# Patient Record
Sex: Male | Born: 1955 | Race: White | Hispanic: No | Marital: Married | Smoking: Current every day smoker
Health system: Southern US, Community
[De-identification: ages and names within clinical notes are randomized; demographics above are authoritative.]

## PROBLEM LIST (undated history)

## (undated) DIAGNOSIS — R251 Tremor, unspecified: Secondary | ICD-10-CM

## (undated) DIAGNOSIS — I10 Essential (primary) hypertension: Secondary | ICD-10-CM

## (undated) DIAGNOSIS — M1990 Unspecified osteoarthritis, unspecified site: Secondary | ICD-10-CM

## (undated) DIAGNOSIS — R739 Hyperglycemia, unspecified: Secondary | ICD-10-CM

## (undated) DIAGNOSIS — R55 Syncope and collapse: Secondary | ICD-10-CM

## (undated) DIAGNOSIS — I2510 Atherosclerotic heart disease of native coronary artery without angina pectoris: Secondary | ICD-10-CM

## (undated) DIAGNOSIS — F411 Generalized anxiety disorder: Secondary | ICD-10-CM

## (undated) DIAGNOSIS — N400 Enlarged prostate without lower urinary tract symptoms: Secondary | ICD-10-CM

## (undated) DIAGNOSIS — R42 Dizziness and giddiness: Secondary | ICD-10-CM

## (undated) HISTORY — DX: Dizziness and giddiness: R42

## (undated) HISTORY — DX: Generalized anxiety disorder: F41.1

## (undated) HISTORY — DX: Hyperglycemia, unspecified: R73.9

## (undated) HISTORY — DX: Benign prostatic hyperplasia without lower urinary tract symptoms: N40.0

## (undated) HISTORY — DX: Syncope and collapse: R55

## (undated) HISTORY — DX: Tremor, unspecified: R25.1

## (undated) HISTORY — DX: Atherosclerotic heart disease of native coronary artery without angina pectoris: I25.10

---

## 2008-08-09 DIAGNOSIS — I219 Acute myocardial infarction, unspecified: Secondary | ICD-10-CM

## 2008-08-09 HISTORY — DX: Acute myocardial infarction, unspecified: I21.9

## 2008-08-09 HISTORY — PX: CORONARY ANGIOPLASTY: SHX604

## 2010-08-09 HISTORY — PX: LIPOMA EXCISION: SHX5283

## 2020-03-31 ENCOUNTER — Ambulatory Visit: Payer: Self-pay

## 2020-03-31 ENCOUNTER — Ambulatory Visit (INDEPENDENT_AMBULATORY_CARE_PROVIDER_SITE_OTHER): Payer: BC Managed Care – PPO | Admitting: Family Medicine

## 2020-03-31 ENCOUNTER — Other Ambulatory Visit: Payer: Self-pay

## 2020-03-31 ENCOUNTER — Ambulatory Visit (INDEPENDENT_AMBULATORY_CARE_PROVIDER_SITE_OTHER): Payer: BC Managed Care – PPO

## 2020-03-31 VITALS — BP 136/80 | HR 79 | Ht 71.0 in | Wt 224.0 lb

## 2020-03-31 DIAGNOSIS — G8929 Other chronic pain: Secondary | ICD-10-CM

## 2020-03-31 DIAGNOSIS — M75102 Unspecified rotator cuff tear or rupture of left shoulder, not specified as traumatic: Secondary | ICD-10-CM

## 2020-03-31 DIAGNOSIS — M25512 Pain in left shoulder: Secondary | ICD-10-CM

## 2020-03-31 DIAGNOSIS — M5136 Other intervertebral disc degeneration, lumbar region: Secondary | ICD-10-CM | POA: Diagnosis not present

## 2020-03-31 MED ORDER — GABAPENTIN 100 MG PO CAPS: 200.0000 mg | ORAL_CAPSULE | Freq: Every day | ORAL | 3 refills | Status: DC

## 2020-03-31 NOTE — Assessment & Plan Note (Signed)
Back pain, multifactorial, gabapentin given, discussed proper shoes, note to give lifting limit at work follow-up in 4 to 8 weeks

## 2020-03-31 NOTE — Progress Notes (Signed)
[name] [address] [place] [address] Phone: [phone] Subjective:   I Grant Ochoa am serving as a Neurosurgeon for Dr. Antoine Primas.  This visit occurred during the SARS-CoV-2 public health emergency.  Safety protocols were in place, including screening questions prior to the visit, additional usage of staff PPE, and extensive cleaning of exam room while observing appropriate contact time as indicated for disinfecting solutions.   I'm seeing this patient by the request  of:  Tarri Fuller, MD  CC: Left shoulder pain  [id number]  Grant Ochoa. is a 64 y.o. male coming in with complaint of left shoulder, lower back and bilateral hip pain. Patient states he can pop his shoulder and the pain goes away. Shoulder pain radiates down his arm. Primary care told him he has an issue at C5-C6. States he has the "shakes" really bad as of the past few months. Hard to hold a cup a coffee. Worse in the right hand.   Onset- Chronic  Location - lateral hip, low back, anterior  Duration-  Character- stabbing pain the shoulder, back dull ache sharp pain in hips  Aggravating factors- shoulder: carrying anything, walking on concrete (feet and hips)  Reliving factors-  Therapies tried- aleve, arnica Severity- 8-9/10 at its worse for the shoulder 5/10 at its worse    Neither of the pain seems to stop him from activity but states that it seems to be more difficult to even do daily activities especially with the shoulder.  States that walking on concrete for long amount and duration seems to be what aggravates the back and the bilateral hips the most.  Denies any weakness of the lower extremities  Social History   Socioeconomic History  . Marital status: Significant Other    Spouse name: Not on file  . Number of children: Not on file  . Years of education: Not on file  . Highest education level: Not on file  Occupational History  . Not on file    Tobacco Use  . Smoking status: Not on file  Substance and Sexual Activity  . Alcohol use: Not on file  . Drug use: Not on file  . Sexual activity: Not on file  Other Topics Concern  . Not on file  Social History Narrative  . Not on file   Social Determinants of Health   Financial Resource Strain:   . Difficulty of Paying Living Expenses: Not on file  Food Insecurity:   . Worried About Programme researcher, broadcasting/film/video in the Last Year: Not on file  . Ran Out of Food in the Last Year: Not on file  Transportation Needs:   . Lack of Transportation (Medical): Not on file  . Lack of Transportation (Non-Medical): Not on file  Physical Activity:   . Days of Exercise per Week: Not on file  . Minutes of Exercise per Session: Not on file  Stress:   . Feeling of Stress : Not on file  Social Connections:   . Frequency of Communication with Friends and Family: Not on file  . Frequency of Social Gatherings with Friends and Family: Not on file  . Attends Religious Services: Not on file  . Active Member of Clubs or Organizations: Not on file  . Attends Banker Meetings: Not on file  . Marital Status: Not on file   Not on File No family history on file.       Current Outpatient Medications (Other):  .  gabapentin (NEURONTIN) 100 MG capsule, Take 2 capsules (200 mg total) by mouth at bedtime.   Reviewed prior external information including notes and imaging from  primary care provider As well as notes that were available from care everywhere and other healthcare systems.  Past medical history, social, surgical and family history all reviewed in electronic medical record.  No pertanent information unless stated regarding to the chief complaint.   Review of Systems:  No headache, visual changes, nausea, vomiting, diarrhea, constipation, dizziness, abdominal pain, skin rash, fevers, chills, night sweats, weight loss, swollen lymph nodes, body aches, joint swelling, chest pain,  shortness of breath, mood changes. POSITIVE muscle aches  Objective  Blood pressure 136/80, pulse 79, height 5\' 11"  (1.803 m), weight 224 lb (101.6 kg), SpO2 96 %.   General: No apparent distress alert and oriented x3 mood and affect normal, dressed appropriately.  HEENT: Pupils equal, extraocular movements intact  Respiratory: Patient's speak in full sentences and does not appear short of breath  Cardiovascular: No lower extremity edema, non tender, no erythema  Neuro: Cranial nerves II through XII are intact, neurovascularly intact in all extremities with 2+ DTRs and 2+ pulses.  Gait normal with good balance and coordination.  MSK: Left shoulder exam shows some very mild atrophy of the surrounding musculature.  Positive impingement with Hawkins and Neer's.  Patient does have 4 out of 5 strength of the rotator cuff.  Positive Jurgenson sign and positive O'Brien's.  Near full passive range of motion contralateral shoulder unremarkable  Low back exam does have some loss of lordosis, tender to palpation in the paraspinal musculature lumbar spine diffusely.  Tightness with FABER test bilaterally and straight leg test but no true radicular symptoms.  Limited musculoskeletal ultrasound was performed and interpreted by  Limited ultrasound of patient's left shoulder shows that there is a rotator cuff noted that seems to be chronic patient wound does likely have some mild retraction noted of the subscapularis.  Bicep tendon has some hypoechoic changes surrounding it.  Supraspinatus has some chronic tearing with some potentially fat atrophy noted as well Impression: Rotator cuff tear  Judi Saa; 15 additional minutes spent for Therapeutic exercises as stated in above notes.  This included exercises focusing on stretching, strengthening, with significant focus on eccentric aspects.   Long term goals include an improvement in range of motion, strength, endurance as well as avoiding reinjury.  Patient's frequency would include in 1-2 times a day, 3-5 times a week for a duration of 6-12 weeks Shoulder Exercises that included:  Basic scapular stabilization to include adduction and depression of scapula Scaption, focusing on proper movement and good control Internal and External rotation utilizing a theraband, with elbow tucked at side entire time Rows with theraband  .  Proper technique shown and discussed handout in great detail with ATC.  All questions were discussed and answered.      Impression and Recommendations:     The above documentation has been reviewed and is accurate and complete [id number], DO       Note: This dictation was prepared with Dragon dictation along with smaller phrase technology. Any transcriptional errors that result from this process are unintentional.

## 2020-03-31 NOTE — Assessment & Plan Note (Signed)
Patient on ultrasound did have a tear but did respond well to the injection.  My feeling is that this tear is approximately 63 years old from patient's previous treatment options.  Concern for potential SLAP tear as well but I am highly optimistic patient can make some improvement.  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  Follow-up again in 6 weeks

## 2020-03-31 NOTE — Patient Instructions (Addendum)
  Good to see you.  Ice 20 minutes 2 times daily. Usually after activity and before bed. Exercises 3 times a week.  pennsaid pinkie amount topically 2 times daily as needed.  Gabapentin 200 mg at night   following over the counter  Turmeric 500mg  daily  Tart cherry extract 1200mg  at night Vitamin D 2000 IU daily  Spenco orthotics "total support" online would be great  See me again in 6 weeks and if shoulder not better will consider MRI

## 2020-04-01 ENCOUNTER — Encounter: Payer: Self-pay | Admitting: Family Medicine

## 2020-05-12 ENCOUNTER — Encounter: Payer: Self-pay | Admitting: Family Medicine

## 2020-05-12 ENCOUNTER — Other Ambulatory Visit: Payer: Self-pay

## 2020-05-12 ENCOUNTER — Ambulatory Visit (INDEPENDENT_AMBULATORY_CARE_PROVIDER_SITE_OTHER): Payer: BC Managed Care – PPO | Admitting: Family Medicine

## 2020-05-12 DIAGNOSIS — M75102 Unspecified rotator cuff tear or rupture of left shoulder, not specified as traumatic: Secondary | ICD-10-CM

## 2020-05-12 NOTE — Assessment & Plan Note (Signed)
Patient is doing better after the injection.  States that for 3 weeks was feeling good but continued do hardly labor with work trying to not use it as much.  Continue the gabapentin.  Continue the home exercises.  Patient is changing jobs we did discuss the possibility of an MRI but with patient improving and not doing surgical intervention patient declined it at this time.  Still concerned that this is a chronic tear but patient is continuing to stay active.  Follow-up with me again 6 to 8 weeks to discuss again advanced imaging.

## 2020-05-12 NOTE — Progress Notes (Signed)
[facility] [address] [place] [address] Phone: [phone] Subjective:   I Grant Ochoa am serving as a Neurosurgeon for Dr. Antoine Ochoa.  This visit occurred during the SARS-CoV-2 public health emergency.  Safety protocols were in place, including screening questions prior to the visit, additional usage of staff PPE, and extensive cleaning of exam room while observing appropriate contact time as indicated for disinfecting solutions.   I'm seeing this patient by the request  of:  Grant Fuller, MD  CC: Shoulder and back pain follow-up  [id number]   03/31/2020 Patient on ultrasound did have a tear but did respond well to the injection.  My feeling is that this tear is approximately 63 years old from patient's previous treatment options.  Concern for potential SLAP tear as well but I am highly optimistic patient can make some improvement.  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  Follow-up again in 6 weeks  Back pain, multifactorial, gabapentin given, discussed proper shoes, note to give lifting limit at work follow-up in 4 to 8 weeks  Update 05/12/2020 Grant Ochoa. is a 64 y.o. male coming in with complaint of left shoulder and back pain. Patient states for 3 weeks it was good. Employer did not value work note. Back is doing well.     Patient has x-rays that correspond to the rotator cuff showing potential rotator cuff arthropathy on the lateral aspect of the humeral head.  No past medical history on file. No past surgical history on file. Social History   Socioeconomic History  . Marital status: Significant Other    Spouse name: Not on file  . Number of children: Not on file  . Years of education: Not on file  . Highest education level: Not on file  Occupational History  .  Not on file  Tobacco Use  . Smoking status: Not on file  Substance and Sexual Activity  . Alcohol use: Not on file  . Drug use: Not on file  . Sexual activity: Not on file  Other Topics Concern  . Not on file  Social History Narrative  . Not on file   Social Determinants of Health   Financial Resource Strain:   . Difficulty of Paying Living Expenses: Not on file  Food Insecurity:   . Worried About Programme researcher, broadcasting/film/video in the Last Year: Not on file  . Ran Out of Food in the Last Year: Not on file  Transportation Needs:   . Lack of Transportation (Medical): Not on file  . Lack of Transportation (Non-Medical): Not on file  Physical Activity:   . Days of Exercise per Week: Not on file  . Minutes of Exercise per Session: Not on file  Stress:   . Feeling of Stress : Not on file  Social Connections:   . Frequency of Communication with Friends and Family: Not on file  . Frequency of Social Gatherings with Friends and Family: Not on file  . Attends Religious Services: Not on file  . Active Member of Clubs or Organizations: Not on file  . Attends Banker Meetings: Not on file  . Marital Status: Not on file   Not on File No family history on file.       Current Outpatient Medications (Other):  .  gabapentin (NEURONTIN)  100 MG capsule, Take 2 capsules (200 mg total) by mouth at bedtime.   Reviewed prior external information including notes and imaging from  primary care provider As well as notes that were available from care everywhere and other healthcare systems.  Past medical history, social, surgical and family history all reviewed in electronic medical record.  No pertanent information unless stated regarding to the chief complaint.   Review of Systems:  No headache, visual changes, nausea, vomiting, diarrhea, constipation, dizziness, abdominal pain, skin rash, fevers, chills, night sweats, weight loss, swollen lymph nodes, body aches, joint swelling, chest  pain, shortness of breath, mood changes. POSITIVE muscle aches  Objective  Blood pressure 102/80, pulse 73, height 5\' 11"  (1.803 m), weight 227 lb (103 kg), SpO2 97 %.   General: No apparent distress alert and oriented x3 mood and affect normal, dressed appropriately.  HEENT: Pupils equal, extraocular movements intact  Respiratory: Patient's speak in full sentences and does not appear short of breath  Cardiovascular: No lower extremity edema, non tender, no erythema  Neuro: Cranial nerves II through XII are intact, neurovascularly intact in all extremities with 2+ DTRs and 2+ pulses.  Gait normal with good balance and coordination.  MSK: Left shoulder exam does show the patient has some improvement in range of motion.  Still 4+ out of 5 strength compared to the contralateral side.  Patient does have very mild weakness with resisted external rotation of the shoulder.    Impression and Recommendations:     The above documentation has been reviewed and is accurate and complete , DO

## 2020-05-12 NOTE — Patient Instructions (Addendum)
Good to see you I am glad at the job change  I do you think you are making some improvement but lets keep a close eye  See me again in 6-8 weeks

## 2020-06-24 NOTE — Progress Notes (Signed)
[name] [address] [place] [address] Phone: [phone] Subjective:   I Ronelle Nigh am serving as a Neurosurgeon for Dr. Antoine Primas.  This visit occurred during the SARS-CoV-2 public health emergency.  Safety protocols were in place, including screening questions prior to the visit, additional usage of staff PPE, and extensive cleaning of exam room while observing appropriate contact time as indicated for disinfecting solutions.   I'm seeing this patient by the request  of:  Tarri Fuller, MD  CC: Shoulder pain follow-up   [id number]   05/12/2020 Patient is doing better after the injection.  States that for 3 weeks was feeling good but continued do hardly labor with work trying to not use it as much.  Continue the gabapentin.  Continue the home exercises.  Patient is changing jobs we did discuss the possibility of an MRI but with patient improving and not doing surgical intervention patient declined it at this time.  Still concerned that this is a chronic tear but patient is continuing to stay active.  Follow-up with me again 6 to 8 weeks to discuss again advanced imaging.  Update 06/26/2020 Baldomero Fernand Sorbello. is a 64 y.o. male coming in with complaint of left RTC tear. Patient states he is better but he still has some issues with certain movements.  Patient states that with the new job seems to be doing much better.  Really having very minimal discomfort over the course of time.  Would state that he is 85 to 90% better.  Patient denies any new symptoms.  Has been taking the gabapentin but is wondering if he needs that long-term.       No past medical history on file. No past surgical history on file. Social History   Socioeconomic History  . Marital status: Significant Other    Spouse name: Not on file  . Number of children: Not on file  . Years of education: Not on file  . Highest education level: Not on file  Occupational  History  . Not on file  Tobacco Use  . Smoking status: Not on file  Substance and Sexual Activity  . Alcohol use: Not on file  . Drug use: Not on file  . Sexual activity: Not on file  Other Topics Concern  . Not on file  Social History Narrative  . Not on file   Social Determinants of Health   Financial Resource Strain:   . Difficulty of Paying Living Expenses: Not on file  Food Insecurity:   . Worried About Programme researcher, broadcasting/film/video in the Last Year: Not on file  . Ran Out of Food in the Last Year: Not on file  Transportation Needs:   . Lack of Transportation (Medical): Not on file  . Lack of Transportation (Non-Medical): Not on file  Physical Activity:   . Days of Exercise per Week: Not on file  . Minutes of Exercise per Session: Not on file  Stress:   . Feeling of Stress : Not on file  Social Connections:   . Frequency of Communication with Friends and Family: Not on file  . Frequency of Social Gatherings with Friends and Family: Not on file  . Attends Religious Services: Not on file  . Active Member of Clubs or Organizations: Not on file  . Attends Banker Meetings: Not on file  . Marital Status: Not on file   Not on File No family history on file.  Current Outpatient Medications (Other):  .  gabapentin (NEURONTIN) 100 MG capsule, Take 2 capsules (200 mg total) by mouth at bedtime.   Reviewed prior external information including notes and imaging from  primary care provider As well as notes that were available from care everywhere and other healthcare systems.  Past medical history, social, surgical and family history all reviewed in electronic medical record.  No pertanent information unless stated regarding to the chief complaint.   Review of Systems:  No headache, visual changes, nausea, vomiting, diarrhea, constipation, dizziness, abdominal pain, skin rash, fevers, chills, night sweats, weight loss, swollen lymph nodes, body aches, joint  swelling, chest pain, shortness of breath, mood changes. POSITIVE muscle aches  Objective  Blood pressure 130/80, pulse (!) 55, height 5\' 11"  (1.803 m), weight 230 lb (104.3 kg), SpO2 98 %.   General: No apparent distress alert and oriented x3 mood and affect normal, dressed appropriately.  HEENT: Pupils equal, extraocular movements intact  Respiratory: Patient's speak in full sentences and does not appear short of breath  Cardiovascular: No lower extremity edema, non tender, no erythema  Patient shoulder exam shows near full range of motion at this time.  4++ out of 5 strength of the rotator cuff nearly as strong as the contralateral side.  Very mild positive impingement still noted to the anterior aspect and mild positive O'Brien's.   Impression and Recommendations:    The above documentation has been reviewed and is accurate and complete , DO

## 2020-06-26 ENCOUNTER — Encounter: Payer: Self-pay | Admitting: Family Medicine

## 2020-06-26 ENCOUNTER — Ambulatory Visit (INDEPENDENT_AMBULATORY_CARE_PROVIDER_SITE_OTHER): Payer: BLUE CROSS/BLUE SHIELD | Admitting: Family Medicine

## 2020-06-26 ENCOUNTER — Other Ambulatory Visit: Payer: Self-pay

## 2020-06-26 DIAGNOSIS — M5136 Other intervertebral disc degeneration, lumbar region: Secondary | ICD-10-CM

## 2020-06-26 DIAGNOSIS — M75102 Unspecified rotator cuff tear or rupture of left shoulder, not specified as traumatic: Secondary | ICD-10-CM

## 2020-06-26 NOTE — Assessment & Plan Note (Signed)
On gabapentin but does not know if he needs it.  Discussed using it as needed.

## 2020-06-26 NOTE — Assessment & Plan Note (Signed)
Patient is doing relatively well at this time.  We discussed that patient still has some pathology that seems to be more labral in nature and an MRI would further evaluate this.  Patient though would not want any surgical intervention and seems to be doing relatively well.  Discussed with patient about icing regimen and home exercises.  Discussed avoiding certain activities.  Patient wants to follow-up again in 3 months.  Understands at that time we will consider the possibility of going to imaging if necessary.

## 2020-06-26 NOTE — Patient Instructions (Signed)
Keep doing exercises Lets see how the new job goes Gabapentin as needed [employer] in [place] Let's check in in 2-3 months Let me know if you change mind on MRI

## 2020-07-03 ENCOUNTER — Other Ambulatory Visit: Payer: Self-pay

## 2020-07-03 ENCOUNTER — Encounter (HOSPITAL_BASED_OUTPATIENT_CLINIC_OR_DEPARTMENT_OTHER): Payer: Self-pay | Admitting: Emergency Medicine

## 2020-07-03 ENCOUNTER — Emergency Department (HOSPITAL_BASED_OUTPATIENT_CLINIC_OR_DEPARTMENT_OTHER): Payer: BC Managed Care – PPO

## 2020-07-03 ENCOUNTER — Emergency Department (HOSPITAL_BASED_OUTPATIENT_CLINIC_OR_DEPARTMENT_OTHER)
Admission: EM | Admit: 2020-07-03 | Discharge: 2020-07-03 | Disposition: A | Discharge status: Home / Self Care | Payer: BC Managed Care – PPO | Attending: Emergency Medicine | Admitting: Emergency Medicine

## 2020-07-03 DIAGNOSIS — S61213A Laceration without foreign body of left middle finger without damage to nail, initial encounter: Secondary | ICD-10-CM | POA: Diagnosis not present

## 2020-07-03 DIAGNOSIS — S61215A Laceration without foreign body of left ring finger without damage to nail, initial encounter: Secondary | ICD-10-CM | POA: Diagnosis not present

## 2020-07-03 DIAGNOSIS — S61214A Laceration without foreign body of right ring finger without damage to nail, initial encounter: Secondary | ICD-10-CM | POA: Diagnosis not present

## 2020-07-03 DIAGNOSIS — S61212A Laceration without foreign body of right middle finger without damage to nail, initial encounter: Secondary | ICD-10-CM | POA: Diagnosis not present

## 2020-07-03 DIAGNOSIS — S61211A Laceration without foreign body of left index finger without damage to nail, initial encounter: Secondary | ICD-10-CM | POA: Diagnosis not present

## 2020-07-03 DIAGNOSIS — W228XXA Striking against or struck by other objects, initial encounter: Secondary | ICD-10-CM | POA: Insufficient documentation

## 2020-07-03 DIAGNOSIS — S61412A Laceration without foreign body of left hand, initial encounter: Secondary | ICD-10-CM

## 2020-07-03 DIAGNOSIS — S6992XA Unspecified injury of left wrist, hand and finger(s), initial encounter: Secondary | ICD-10-CM | POA: Diagnosis present

## 2020-07-03 DIAGNOSIS — Y92094 Garage of other non-institutional residence as the place of occurrence of the external cause: Secondary | ICD-10-CM | POA: Diagnosis not present

## 2020-07-03 DIAGNOSIS — Z23 Encounter for immunization: Secondary | ICD-10-CM | POA: Insufficient documentation

## 2020-07-03 DIAGNOSIS — S61012A Laceration without foreign body of left thumb without damage to nail, initial encounter: Secondary | ICD-10-CM | POA: Insufficient documentation

## 2020-07-03 DIAGNOSIS — T07XXXA Unspecified multiple injuries, initial encounter: Secondary | ICD-10-CM

## 2020-07-03 HISTORY — DX: Atherosclerotic heart disease of native coronary artery without angina pectoris: I25.10

## 2020-07-03 MED ORDER — CEPHALEXIN 500 MG PO CAPS: 500.0000 mg | ORAL_CAPSULE | Freq: Three times a day (TID) | ORAL | 0 refills | Status: DC

## 2020-07-03 MED ORDER — SILVER NITRATE-POT NITRATE 75-25 % EX MISC: 5.0000 | Freq: Once | CUTANEOUS | Status: AC

## 2020-07-03 MED ORDER — SILVER NITRATE-POT NITRATE 75-25 % EX MISC
CUTANEOUS | Status: AC
  Administered 2020-07-03: 2 via TOPICAL
  Filled 2020-07-03: qty 10

## 2020-07-03 MED ORDER — LIDOCAINE HCL (PF) 1 % IJ SOLN
20.0000 mL | Freq: Once | INTRAMUSCULAR | Status: DC
  Filled 2020-07-03: qty 20

## 2020-07-03 MED ORDER — HYDROCODONE-ACETAMINOPHEN 5-325 MG PO TABS
1.0000 | ORAL_TABLET | ORAL | 0 refills | Status: DC | PRN
Start: 2020-07-03 — End: 2022-06-28

## 2020-07-03 MED ORDER — TETANUS-DIPHTH-ACELL PERTUSSIS 5-2.5-18.5 LF-MCG/0.5 IM SUSY
0.5000 mL | PREFILLED_SYRINGE | Freq: Once | INTRAMUSCULAR | Status: AC
  Administered 2020-07-03: 0.5 mL via INTRAMUSCULAR
  Filled 2020-07-03: qty 0.5

## 2020-07-03 NOTE — ED Notes (Signed)
Bilateral hand rinsed with saline + iodine

## 2020-07-03 NOTE — ED Notes (Addendum)
Pressure dressings applied by EDP to all affected fingers temporarily in preparation for Xray. All bleeding controlled.

## 2020-07-03 NOTE — ED Notes (Signed)
Tornicot applied to Left pointer finger by Dr. Rush Landmark

## 2020-07-03 NOTE — ED Provider Notes (Signed)
[facility] EMERGENCY DEPARTMENT Provider Note   CSN: [id number] Arrival date & time: 07/03/20  1124     History Chief Complaint  Patient presents with  . Hand Injury    Grant Ochoa. is a 64 y.o. male.  The history is provided by the patient, the spouse and medical records. No language interpreter was used.  Hand Injury Location:  Finger Finger location:  L index finger, L middle finger, L thumb, L ring finger, R middle finger and R ring finger Injury: yes   Time since incident:  1 hour Mechanism of injury: fall   Fall:    Impact surface: garadge door.   Point of impact:  Hands   Entrapped after fall: no   Pain details:    Quality:  Aching   Radiates to:  Does not radiate   Severity:  Moderate   Onset quality:  Sudden   Timing:  Constant   Progression:  Unchanged Handedness:  Right-handed Dislocation: no   Foreign body present:  No foreign bodies Tetanus status:  Unknown Prior injury to area:  No Relieved by:  Nothing Worsened by:  Nothing Ineffective treatments:  None tried Associated symptoms: muscle weakness and numbness   Associated symptoms: no back pain, no fatigue, no fever, no neck pain and no stiffness   Risk factors: no known bone disorder and no frequent fractures        Past Medical History:  Diagnosis Date  . Coronary artery disease     Patient Active Problem List   Diagnosis Date Noted  . Left rotator cuff tear 03/31/2020  . Degenerative disc disease, lumbar 03/31/2020    History reviewed. No pertinent surgical history.     No family history on file.  Social History   Tobacco Use  . Smoking status: Never Smoker  . Smokeless tobacco: Never Used  Substance Use Topics  . Alcohol use: Not on file  . Drug use: Not on file    Home Medications Prior to Admission medications   Medication Sig Start Date End Date Taking? Authorizing Provider  gabapentin (NEURONTIN) 100 MG capsule Take 2 capsules (200 mg total) by  mouth at bedtime. 03/31/20   Judi Saa, DO    Allergies    Patient has no known allergies.  Review of Systems   Review of Systems  Constitutional: Negative for chills, fatigue and fever.  HENT: Negative for congestion.   Eyes: Negative for visual disturbance.  Respiratory: Negative for cough, chest tightness, shortness of breath and wheezing.   Cardiovascular: Negative for chest pain.  Gastrointestinal: Negative for abdominal pain.  Genitourinary: Negative for flank pain.  Musculoskeletal: Negative for back pain, neck pain and stiffness.  Skin: Positive for wound.  Neurological: Negative for light-headedness and headaches.  All other systems reviewed and are negative.   Physical Exam Updated Vital Signs BP (!) 161/103 (BP Location: Right Arm)   Pulse 88   Resp 20   Ht 5\' 10"  (1.778 m)   Wt 97.5 kg   SpO2 98%   BMI 30.85 kg/m   Physical Exam Vitals and nursing note reviewed.  Constitutional:      General: He is in acute distress (actrive hemorrhage).     Appearance: He is not ill-appearing, toxic-appearing or diaphoretic.  HENT:     Nose: Nose normal. No congestion or rhinorrhea.     Mouth/Throat:     Mouth: Mucous membranes are moist.     Pharynx: No posterior oropharyngeal erythema.  Eyes:     Pupils: Pupils are equal, round, and reactive to light.  Cardiovascular:     Rate and Rhythm: Normal rate.     Pulses: Normal pulses.     Heart sounds: No murmur heard.   Pulmonary:     Effort: Pulmonary effort is normal.     Breath sounds: No wheezing, rhonchi or rales.  Chest:     Chest wall: No tenderness.  Abdominal:     General: Abdomen is flat.     Tenderness: There is no abdominal tenderness. There is no right CVA tenderness or left CVA tenderness.  Musculoskeletal:        General: Tenderness and signs of injury present.     Right hand: Laceration and tenderness present. Decreased strength. Decreased sensation. Normal capillary refill. Normal pulse.      Left hand: Laceration present. Normal capillary refill. Normal pulse.       Hands:     Cervical back: No tenderness.     Comments: Patient has evidence of likely tendinous injury to his left index finger and has some numbness in the left index, middle, and left ring finger.  No other numbness appreciated in the fingers.  All other strength intact including lumbrical movement wrist movement in all thumb range of motion.  No numbness in the thumb.  Before and after repair, all fingers have good capillary refill and were pink.  They were splinted.  Skin:    Capillary Refill: Capillary refill takes less than 2 seconds.     Coloration: Skin is not pale.  Neurological:     General: No focal deficit present.     Mental Status: He is alert.  Psychiatric:        Mood and Affect: Mood normal.     ED Results / Procedures / Treatments   Labs (all labs ordered are listed, but only abnormal results are displayed) Labs Reviewed - No data to display  EKG None  Radiology DG Hand Complete Left  Result Date: 07/03/2020 CLINICAL DATA:  Finger laceration after fall. EXAM: LEFT HAND - COMPLETE 3+ VIEW COMPARISON:  None. FINDINGS: There is bandage material overlying the distal second, third and fourth digits. The underlying osseous structures are intact. No fractures identified. No retained foreign bodies noted. IMPRESSION: 1. No acute bone abnormality. 2. No retained foreign bodies. Electronically Signed   By: Signa Kellaylor  Stroud M.D.   On: 07/03/2020 12:26   DG Hand Complete Right  Result Date: 07/03/2020 CLINICAL DATA:  Finger lacerations. EXAM: RIGHT HAND - COMPLETE 3+ VIEW COMPARISON:  None. FINDINGS: Bandage material overlies the second and third digits. No underlying fracture or dislocation identified. No radiopaque foreign bodies. IMPRESSION: 1. No acute findings. 2. No radiopaque foreign bodies identified. Electronically Signed   By: Signa Kellaylor  Stroud M.D.   On: 07/03/2020 12:27     Procedures .Marland Kitchen.Laceration Repair  Date/Time: 07/03/2020 3:35 PM Performed by: Heide Scalesegeler, Kaari Zeigler J, MD Authorized by: Heide Scalesegeler, Emie Sommerfeld J, MD   Consent:    Consent obtained:  Verbal   Consent given by:  Patient   Risks discussed:  Tendon damage, infection, pain, poor cosmetic result, poor wound healing, need for additional repair, nerve damage and vascular damage   Alternatives discussed:  No treatment Anesthesia (see MAR for exact dosages):    Anesthesia method:  Nerve block   Block location:  Digital blocks    Block needle gauge:  24 G   Block anesthetic:  Lidocaine 1% w/o epi   Block technique:  Digital   Block injection procedure:  Introduced needle, incremental injection, negative aspiration for blood, anatomic landmarks palpated and anatomic landmarks identified   Block outcome:  Anesthesia achieved Laceration details:    Location:  Finger   Finger location:  L index finger   Length (cm):  3   Depth (mm):  2 Repair type:    Repair type:  Complex Pre-procedure details:    Preparation:  Patient was prepped and draped in usual sterile fashion and imaging obtained to evaluate for foreign bodies Exploration:    Limited defect created (wound extended): no     Hemostasis achieved with:  Tied off vessels, tourniquet and direct pressure   Wound exploration: wound explored through full range of motion and entire depth of wound probed and visualized     Wound extent: tendon damage     Wound extent: no vascular damage noted  Nerve damage: possible.     Tendon damage location:  Upper extremity   Upper extremity tendon damage location:  Finger flexor   Tendon repair plan:  Refer for evaluation   Contaminated: no   Treatment:    Area cleansed with:  Saline and Shur-Clens   Amount of cleaning:  Standard   Irrigation solution:  Sterile saline   Irrigation method:  Syringe   Visualized foreign bodies/material removed: no     Debridement:  None Skin repair:    Repair  method:  Sutures   Suture size:  5-0   Suture material:  Nylon   Suture technique:  Simple interrupted and figure eight   Number of sutures:  12 Approximation:    Approximation:  Close Post-procedure details:    Dressing:  Bulky dressing and splint for protection   Patient tolerance of procedure:  Tolerated well, no immediate complications .Marland KitchenLaceration Repair  Date/Time: 07/03/2020 3:37 PM Performed by: Heide Scales, MD Authorized by: Heide Scales, MD   Consent:    Consent obtained:  Verbal Laceration details:    Location:  Finger   Finger location:  L ring finger   Length (cm):  3   Depth (mm):  2 Repair type:    Repair type:  Complex Pre-procedure details:    Preparation:  Patient was prepped and draped in usual sterile fashion and imaging obtained to evaluate for foreign bodies Exploration:    Limited defect created (wound extended): no     Hemostasis achieved with:  Direct pressure   Wound exploration: wound explored through full range of motion and entire depth of wound probed and visualized     Wound extent: no tendon damage noted, no underlying fracture noted and no vascular damage noted  Nerve damage: possible.     Contaminated: no   Treatment:    Area cleansed with:  Saline and Hibiclens   Amount of cleaning:  Standard   Irrigation solution:  Sterile saline   Irrigation method:  Syringe   Visualized foreign bodies/material removed: no     Debridement:  Minimal   Undermining:  None   Scar revision: no   Skin repair:    Repair method:  Sutures   Suture size:  5-0   Suture material:  Nylon   Suture technique:  Horizontal mattress and simple interrupted   Number of sutures:  10 Approximation:    Approximation:  Close Post-procedure details:    Dressing:  Bulky dressing and splint for protection   Patient tolerance of procedure:  Tolerated well, no immediate complications .Marland KitchenLaceration Repair  Date/Time: 07/03/2020 3:39  PM Performed by:  Heide Scales, MD Authorized by: Heide Scales, MD   Consent:    Consent obtained:  Verbal   Consent given by:  Patient Anesthesia (see MAR for exact dosages):    Anesthesia method:  Nerve block Laceration details:    Location:  Finger   Finger location:  L long finger   Length (cm):  3   Depth (mm):  2 Repair type:    Repair type:  Intermediate Exploration:    Hemostasis achieved with:  Direct pressure   Wound extent: no tendon damage noted, no underlying fracture noted and no vascular damage noted  Nerve damage: possible.   Treatment:    Area cleansed with:  Hibiclens and saline   Amount of cleaning:  Standard   Irrigation solution:  Sterile saline   Irrigation method:  Syringe Skin repair:    Repair method:  Sutures   Suture size:  5-0   Suture material:  Nylon   Suture technique:  Simple interrupted   Number of sutures:  8 Post-procedure details:    Dressing:  Bulky dressing and splint for protection   Patient tolerance of procedure:  Tolerated well, no immediate complications   (including critical care time)  CRITICAL CARE Performed by: Canary Brim Martha Ellerby Total critical care time: 20 minutes Critical care time was exclusive of separately billable procedures and treating other patients. Needed emergent use of finger tourniquet and figure-of-eight suture to achieve hemostasis from severe hemorrhage on arrival. Critical care was necessary to treat or prevent imminent or life-threatening deterioration. Critical care was time spent personally by me on the following activities: development of treatment plan with patient and/or surrogate as well as nursing, discussions with consultants, evaluation of patient's response to treatment, examination of patient, obtaining history from patient or surrogate, ordering and performing treatments and interventions, ordering and review of laboratory studies, ordering and review of radiographic studies, pulse oximetry  and re-evaluation of patient's condition.  Medications Ordered in ED Medications  lidocaine (PF) (XYLOCAINE) 1 % injection 20 mL (has no administration in time range)  Tdap (BOOSTRIX) injection 0.5 mL (0.5 mLs Intramuscular Given 07/03/20 1225)  silver nitrate applicators applicator 5 Stick (2 Sticks Topical Given by Other 07/03/20 1439)    ED Course  I have reviewed the triage vital signs and the nursing notes.  Pertinent labs & imaging results that were available during my care of the patient were reviewed by me and considered in my medical decision making (see chart for details).    MDM Rules/Calculators/A&P                          Grant Ochoa. is a 64 y.o. male with a past medical history significant for CAD status post PCI, hypertension, hyperlipidemia, and on aspirin therapy who presents with head lacerations.  Patient reports that he was sitting in the garage on a stool when the stool collapsed and he reached out to try to catch himself and cut both of his hands on a garage door edge.  He reports immediate onset of severe bleeding that began soaking through multiple towels on the way here.  He did not hit his head or lose consciousness.  He denies other injuries aside from both hands.  He is right-handed.  On arrival, patient had arterial bleeding from his left index finger.  He was also oozing blood from his left thumb, middle finger, ring finger, and left ring finger  and middle finger.  Bleeding was controlled with a figure-of-eight stitch in the left index finger superficially that stopped the pulsatile bleeding.  Pressure dressings were then placed on all of the injured fingers and x-rays were obtained.  There were no fractures or foreign bodies that were seen.  Tetanus was updated.  Patient had laceration repairs on his fingers.    After laceration repair, we reassessed all of the tips of his fingers and they all had good cap refill and were pink.  They were wrapped  and dressed and splinted so he will not bend them or rip out the sutures.  He will follow-up with Dr. Merlyn Lot with hand surgery and will call them on Monday.  Patient knows to watch for signs symptoms of infection.  He knows to wash the tips of his fingers for decreased color.  Prescription for antibiotics was given as was pain medicine given the extent of the injuries.  Patient and family understand return precautions and follow-up instructions and had no other questions or concerns.  Patient was discharged in good condition.   Final Clinical Impression(s) / ED Diagnoses Final diagnoses:  Multiple lacerations  Laceration of left hand involving tendon, initial encounter    Rx / DC Orders ED Discharge Orders         Ordered    cephALEXin (KEFLEX) 500 MG capsule  3 times daily        07/03/20 1524    HYDROcodone-acetaminophen (NORCO/VICODIN) 5-325 MG tablet  Every 4 hours PRN        07/03/20 1524          Clinical Impression: 1. Multiple lacerations   2. Laceration of left hand involving tendon, initial encounter     Disposition: Discharge  Condition: Good  I have discussed the results, Dx and Tx plan with the pt(& family if present). He/she/they expressed understanding and agree(s) with the plan. Discharge instructions discussed at great length. Strict return precautions discussed and pt &/or family have verbalized understanding of the instructions. No further questions at time of discharge.    New Prescriptions   CEPHALEXIN (KEFLEX) 500 MG CAPSULE    Take 1 capsule (500 mg total) by mouth 3 (three) times daily for 7 days.   HYDROCODONE-ACETAMINOPHEN (NORCO/VICODIN) 5-325 MG TABLET    Take 1 tablet by mouth every 4 (four) hours as needed.    Follow Up: Betha Loa, MD [address] [place] [address] [phone]   with hand surgery. please call on monday to schedule appointment.     Mosie Angus, Canary Brim, MD 07/03/20 [phone]

## 2020-07-03 NOTE — Discharge Instructions (Addendum)
Your history and exam today are consistent with multiple lacerations on both of your hands.  X-rays today did not show evidence of fracture or any foreign bodies.  After speaking with hand surgery, they requested we repaired the wounds externally and then follow-up in clinic next week to discuss further management of the left index finger that likely has a tendon injury as well.  He will give further guidance on suture middle but typically is around 10 days for arrival.  Please take the antibiotics to prevent infection at the recommendation.  Please use the pain medicines up with a discomfort and please be careful not to bend the fingers and tear the sutures.  You had 1 absorbable suture and numerous nonabsorbable sutures.  Please watch for signs and symptoms of infection.  If any symptoms change or worsen, please return to the nearest emergency department.

## 2020-07-03 NOTE — ED Notes (Signed)
ED Provider at bedside. 

## 2020-07-03 NOTE — ED Triage Notes (Signed)
He tripped and fell and grabbed a garage door. Lacerations to fingers of both hands. Bleeding is not controlled.

## 2020-07-03 NOTE — ED Notes (Signed)
ED Provider at bedside for sutures. 

## 2020-07-03 NOTE — ED Notes (Signed)
XR at bedside

## 2020-07-03 NOTE — ED Provider Notes (Addendum)
..Laceration Repair  Date/Time: 07/03/2020 2:57 PM Performed by: Gailen Shelter, PA Authorized by: Gailen Shelter, PA   Consent:    Consent obtained:  Verbal   Consent given by:  Patient   Risks discussed:  Infection, need for additional repair, pain, poor cosmetic result and poor wound healing   Alternatives discussed:  No treatment and delayed treatment Universal protocol:    Procedure explained and questions answered to patient or proxy's satisfaction: yes     Relevant documents present and verified: yes     Test results available and properly labeled: yes     Imaging studies available: yes     Required blood products, implants, devices, and special equipment available: yes     Site/side marked: yes     Immediately prior to procedure, a time out was called: yes     Patient identity confirmed:  Verbally with patient Anesthesia (see MAR for exact dosages):    Anesthesia method:  Local infiltration   Local anesthetic:  Lidocaine 1% WITH epi Laceration details:    Location:  Finger   Finger location:  L thumb   Length (cm):  2 Repair type:    Repair type:  Simple Exploration:    Hemostasis achieved with:  Direct pressure and epinephrine   Wound extent: no foreign bodies/material noted and no tendon damage noted     Contaminated: no   Treatment:    Area cleansed with:  Saline   Amount of cleaning:  Standard   Irrigation solution:  Sterile saline   Irrigation method:  Pressure wash   Visualized foreign bodies/material removed: no   Skin repair:    Repair method:  Sutures   Suture size:  5-0   Suture material:  Nylon (ethilon )   Suture technique:  Simple interrupted   Number of sutures:  3 Approximation:    Approximation:  Close Post-procedure details:    Dressing:  Antibiotic ointment and non-adherent dressing   Patient tolerance of procedure:  Tolerated well, no immediate complications .Marland KitchenLaceration Repair  Date/Time: 07/03/2020 2:59 PM Performed by: Gailen Shelter, PA Authorized by: Gailen Shelter, PA   Consent:    Consent obtained:  Verbal   Consent given by:  Patient   Risks discussed:  Infection, need for additional repair, pain, poor cosmetic result and poor wound healing   Alternatives discussed:  No treatment and delayed treatment Universal protocol:    Procedure explained and questions answered to patient or proxy's satisfaction: yes     Relevant documents present and verified: yes     Test results available and properly labeled: yes     Imaging studies available: yes     Required blood products, implants, devices, and special equipment available: yes     Site/side marked: yes     Immediately prior to procedure, a time out was called: yes     Patient identity confirmed:  Verbally with patient Anesthesia (see MAR for exact dosages):    Anesthesia method:  Local infiltration   Local anesthetic:  Lidocaine 1% WITH epi Laceration details:    Location:  Finger   Finger location:  L ring finger   Length (cm):  1 Repair type:    Repair type:  Simple Exploration:    Hemostasis achieved with:  Direct pressure and epinephrine   Wound extent: no foreign bodies/material noted and no tendon damage noted     Contaminated: no   Treatment:    Area cleansed with:  Saline  Amount of cleaning:  Standard   Irrigation solution:  Sterile saline   Irrigation method:  Pressure wash   Visualized foreign bodies/material removed: no   Skin repair:    Repair method:  Sutures   Suture size:  5-0   Suture material:  Nylon   Suture technique:  Simple interrupted   Number of sutures:  1 Approximation:    Approximation:  Close Post-procedure details:    Dressing:  Antibiotic ointment and non-adherent dressing   Patient tolerance of procedure:  Tolerated well, no immediate complications Cauterization  Date/Time: 07/03/2020 3:01 PM Performed by: Gailen Shelter, PA Authorized by: Gailen Shelter, PA  Local anesthesia used:  no  Anesthesia: Local anesthesia used: no  Sedation: Patient sedated: no  Patient tolerance: patient tolerated the procedure well with no immediate complications Comments: Cautery of the right index finger at the middle pad the volar aspect. There was a 1 cm avulsion with active bleeding. Controlled with cautery.  Wrapped w/ coban    Remaining (extensive) repairs done by Dr. Jerene Canny, Rodrigo Ran, PA 07/03/20 1502    Tegeler, Canary Brim, MD 07/03/20 1514    [place], [place] 07/03/20 1541    Tegeler, Canary Brim, MD 07/03/20 1549

## 2020-07-03 NOTE — ED Notes (Signed)
EDP at bedside  

## 2020-07-03 NOTE — ED Notes (Signed)
X-Reason at bedside.

## 2020-07-03 NOTE — ED Notes (Signed)
tornicot removed from L ring finger by EDP, bleeding better controlled

## 2020-07-03 NOTE — ED Notes (Signed)
ED Provider at bedside for repair of lacs

## 2020-07-09 ENCOUNTER — Other Ambulatory Visit: Payer: Self-pay | Admitting: Orthopedic Surgery

## 2020-07-09 ENCOUNTER — Telehealth: Payer: Self-pay | Admitting: Family Medicine

## 2020-07-09 NOTE — Telephone Encounter (Signed)
Spoke with patient.

## 2020-07-09 NOTE — Telephone Encounter (Signed)
Per notres that I reviewed from ED and form Dr. Merlyn Lot, surgery does likely give him best chance at recovery.  I have to defer to Dr. Merlyn Lot on this one

## 2020-07-09 NOTE — Progress Notes (Signed)
Patient scheduled at [facility] for for left hand exploration from laceration. 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 He just reconnected with cards 03-20-20 Dr Judithe Modest. Pt was complaining of palpitations and left shoulder pain at that time and Dr Judithe Modest ordered a myoview stress test and a long term monitor. Patient states at that time he was busy with his job and missed all appts. Chart was reviewed with Dr Chaney Malling and he states that if done at [facility] he will need to have tests done first and if moved to Main OR he will need cardiac clearance to progress with surgery. Steward Drone at Dr Merrilee Seashore office notified.

## 2020-07-09 NOTE — Telephone Encounter (Signed)
Patient called stating that he injured his hand on Thanksgiving day and they are wanting to do surgery. He asked if you or Dr Katrinka Blazing could call him to get your opinion on what he should do.  Please advise.

## 2020-07-11 ENCOUNTER — Other Ambulatory Visit (HOSPITAL_COMMUNITY): Admission: RE | Admit: 2020-07-11 | Payer: BC Managed Care – PPO | Source: Ambulatory Visit

## 2020-07-12 ENCOUNTER — Other Ambulatory Visit (HOSPITAL_COMMUNITY)
Admission: RE | Admit: 2020-07-12 | Discharge: 2020-07-12 | Disposition: A | Discharge status: Home / Self Care | Payer: BC Managed Care – PPO | Source: Ambulatory Visit | Attending: Orthopedic Surgery | Admitting: Orthopedic Surgery

## 2020-07-12 DIAGNOSIS — S61012A Laceration without foreign body of left thumb without damage to nail, initial encounter: Secondary | ICD-10-CM | POA: Diagnosis present

## 2020-07-12 DIAGNOSIS — Z20822 Contact with and (suspected) exposure to covid-19: Secondary | ICD-10-CM | POA: Insufficient documentation

## 2020-07-12 DIAGNOSIS — Z888 Allergy status to other drugs, medicaments and biological substances status: Secondary | ICD-10-CM | POA: Diagnosis not present

## 2020-07-12 DIAGNOSIS — S61215A Laceration without foreign body of left ring finger without damage to nail, initial encounter: Secondary | ICD-10-CM | POA: Diagnosis not present

## 2020-07-12 DIAGNOSIS — X58XXXA Exposure to other specified factors, initial encounter: Secondary | ICD-10-CM | POA: Diagnosis not present

## 2020-07-12 DIAGNOSIS — F17210 Nicotine dependence, cigarettes, uncomplicated: Secondary | ICD-10-CM | POA: Diagnosis not present

## 2020-07-12 DIAGNOSIS — S61213A Laceration without foreign body of left middle finger without damage to nail, initial encounter: Secondary | ICD-10-CM | POA: Diagnosis not present

## 2020-07-12 DIAGNOSIS — Z955 Presence of coronary angioplasty implant and graft: Secondary | ICD-10-CM | POA: Diagnosis not present

## 2020-07-12 LAB — SARS CORONAVIRUS 2 (TAT 6-24 HRS): SARS Coronavirus 2: NEGATIVE

## 2020-07-14 ENCOUNTER — Encounter (HOSPITAL_COMMUNITY): Payer: Self-pay | Admitting: Orthopedic Surgery

## 2020-07-14 NOTE — Progress Notes (Signed)
Anesthesia Chart Review: Same day workup  History of NSTEMI in 2010 treated with stent to the RCA.  Last seen by cardiologist Dr. Judithe Modest 03/20/2020.  At that time he was re presenting after having not been seen for several years with complaint of palpitations.  Dr. Judithe Modest ordered event monitor, nuclear stress, echo for evaluation.  These tests have not yet been done.  However, patient has been cleared by Dr. Judithe Modest to undergo surgery at low risk from cardiac standpoint, clearance dated 07/11/20, copy on chart.  Will need DOS labs and eval.  EKG 03/20/2020 (narrative only, Care Everywhere): Sinus rhythm.  Rate 65.  LAD.  Nonspecific intraventricular block.  Nonspecific ST and T wave abnormality.   Zannie Cove [facility]/Anesthesiology Phone [phone] 07/14/2020 4:08 PM

## 2020-07-14 NOTE — Anesthesia Preprocedure Evaluation (Addendum)
Anesthesia Evaluation  Patient identified by MRN, date of birth, ID band Patient awake    Reviewed: Allergy & Precautions, NPO status , Patient's Chart, lab work & pertinent test results, reviewed documented beta blocker date and time   Airway Mallampati: II  TM Distance: >3 FB Neck ROM: Full    Dental  (+) Dental Advisory Given   Pulmonary Current Smoker and Patient abstained from smoking.,    Pulmonary exam normal        Cardiovascular hypertension, Pt. on medications and Pt. on home beta blockers + CAD, + Past MI and + Cardiac Stents  Normal cardiovascular exam     Neuro/Psych negative neurological ROS  negative psych ROS   GI/Hepatic negative GI ROS, Neg liver ROS,   Endo/Other   Obesity   Renal/GU negative Renal ROS     Musculoskeletal  (+) Arthritis ,   Abdominal   Peds  Hematology negative hematology ROS (+)   Anesthesia Other Findings Covid test negative   Reproductive/Obstetrics                            Anesthesia Physical Anesthesia Plan  ASA: III  Anesthesia Plan: Regional   Post-op Pain Management:    Induction: Intravenous  PONV Risk Score and Plan: 1 and Propofol infusion and Treatment may vary due to age or medical condition  Airway Management Planned: Natural Airway and Simple Face Mask  Additional Equipment: None  Intra-op Plan:   Post-operative Plan:   Informed Consent: I have reviewed the patients History and Physical, chart, labs and discussed the procedure including the risks, benefits and alternatives for the proposed anesthesia with the patient or authorized representative who has indicated his/her understanding and acceptance.       Plan Discussed with: CRNA and Anesthesiologist  Anesthesia Plan Comments:        Anesthesia Quick Evaluation

## 2020-07-14 NOTE — Progress Notes (Signed)
Spoke with pt for pre-op call. Pt has hx of MI in 2010 with 1 stent. Denies any recent chest pain or sob. Has seen his cardiologist, Dr. Judithe Modest this fall. Waiting on cardiac clearance from Dr. Judithe Modest, have requested that from Dr. Perry Mount office and Dr. Merrilee Seashore office. Pt denies being diagnosed with Diabetes or Pre-diabetes. Found an A1C on 10/20/18 that was 6.5  Covid test done on 07/12/20 and it's negative.  Pt states he's been in quarantine since the test was done and understands that he stays in quarantine until he comes to the hospital tomorrow.

## 2020-07-15 ENCOUNTER — Encounter (HOSPITAL_COMMUNITY): Payer: Self-pay | Admitting: Orthopedic Surgery

## 2020-07-15 ENCOUNTER — Ambulatory Visit (HOSPITAL_COMMUNITY)
Admission: RE | Admit: 2020-07-15 | Discharge: 2020-07-15 | Disposition: A | Discharge status: Home / Self Care | Payer: BC Managed Care – PPO | Attending: Orthopedic Surgery | Admitting: Orthopedic Surgery

## 2020-07-15 ENCOUNTER — Other Ambulatory Visit: Payer: Self-pay

## 2020-07-15 ENCOUNTER — Encounter (HOSPITAL_COMMUNITY)
Admission: RE | Disposition: A | Discharge status: Home / Self Care | Payer: Self-pay | Source: Home / Self Care | Attending: Orthopedic Surgery

## 2020-07-15 ENCOUNTER — Ambulatory Visit (HOSPITAL_COMMUNITY): Payer: BC Managed Care – PPO | Admitting: Physician Assistant

## 2020-07-15 DIAGNOSIS — Z955 Presence of coronary angioplasty implant and graft: Secondary | ICD-10-CM | POA: Insufficient documentation

## 2020-07-15 DIAGNOSIS — Z20822 Contact with and (suspected) exposure to covid-19: Secondary | ICD-10-CM | POA: Insufficient documentation

## 2020-07-15 DIAGNOSIS — S61012A Laceration without foreign body of left thumb without damage to nail, initial encounter: Secondary | ICD-10-CM | POA: Insufficient documentation

## 2020-07-15 DIAGNOSIS — S61215A Laceration without foreign body of left ring finger without damage to nail, initial encounter: Secondary | ICD-10-CM | POA: Insufficient documentation

## 2020-07-15 DIAGNOSIS — X58XXXA Exposure to other specified factors, initial encounter: Secondary | ICD-10-CM | POA: Insufficient documentation

## 2020-07-15 DIAGNOSIS — S66121A Laceration of flexor muscle, fascia and tendon of left index finger at wrist and hand level, initial encounter: Secondary | ICD-10-CM

## 2020-07-15 DIAGNOSIS — F17210 Nicotine dependence, cigarettes, uncomplicated: Secondary | ICD-10-CM | POA: Insufficient documentation

## 2020-07-15 DIAGNOSIS — Z888 Allergy status to other drugs, medicaments and biological substances status: Secondary | ICD-10-CM | POA: Insufficient documentation

## 2020-07-15 DIAGNOSIS — S61213A Laceration without foreign body of left middle finger without damage to nail, initial encounter: Secondary | ICD-10-CM | POA: Insufficient documentation

## 2020-07-15 DIAGNOSIS — Z419 Encounter for procedure for purposes other than remedying health state, unspecified: Secondary | ICD-10-CM

## 2020-07-15 HISTORY — DX: Essential (primary) hypertension: I10

## 2020-07-15 HISTORY — DX: Unspecified osteoarthritis, unspecified site: M19.90

## 2020-07-15 HISTORY — PX: ARTERY AND TENDON REPAIR: SHX5696

## 2020-07-15 LAB — BASIC METABOLIC PANEL
Anion gap: 10 (ref 5–15)
BUN: 13 mg/dL (ref 8–23)
CO2: 23 mmol/L (ref 22–32)
Calcium: 9.1 mg/dL (ref 8.9–10.3)
Chloride: 105 mmol/L (ref 98–111)
Creatinine, Ser: 0.94 mg/dL (ref 0.61–1.24)
GFR, Estimated: >60 mL/min (ref >60)
Glucose, Bld: 122 mg/dL — ABNORMAL HIGH (ref 70–99)
Potassium: 4.2 mmol/L (ref 3.5–5.1)
Sodium: 138 mmol/L (ref 135–145)

## 2020-07-15 LAB — CBC
HCT: 49.4 % (ref 39.0–52.0)
Hemoglobin: 16.3 g/dL (ref 13.0–17.0)
MCH: 30.8 pg (ref 26.0–34.0)
MCHC: 33 g/dL (ref 30.0–36.0)
MCV: 93.4 fL (ref 80.0–100.0)
Platelets: 290 10*3/uL (ref 150–400)
RBC: 5.29 MIL/uL (ref 4.22–5.81)
RDW: 13.1 % (ref 11.5–15.5)
WBC: 7.3 10*3/uL (ref 4.0–10.5)
nRBC: 0 % (ref 0.0–0.2)

## 2020-07-15 SURGERY — ARTERY AND TENDON REPAIR
Anesthesia: Regional | Site: Hand | Laterality: Left

## 2020-07-15 MED ORDER — LIDOCAINE 2% (20 MG/ML) 5 ML SYRINGE
INTRAMUSCULAR | Status: DC | PRN
  Administered 2020-07-15: 100 mg via INTRAVENOUS

## 2020-07-15 MED ORDER — BUPIVACAINE-EPINEPHRINE (PF) 0.5% -1:200000 IJ SOLN
INTRAMUSCULAR | Status: DC | PRN
  Administered 2020-07-15: 30 mL via PERINEURAL

## 2020-07-15 MED ORDER — PROPOFOL 500 MG/50ML IV EMUL
INTRAVENOUS | Status: DC | PRN
  Administered 2020-07-15: 50 ug/kg/min via INTRAVENOUS

## 2020-07-15 MED ORDER — ORAL CARE MOUTH RINSE: 15.0000 mL | Freq: Once | OROMUCOSAL | Status: DC

## 2020-07-15 MED ORDER — LACTATED RINGERS IV SOLN: INTRAVENOUS | Status: DC

## 2020-07-15 MED ORDER — FENTANYL CITRATE (PF) 250 MCG/5ML IJ SOLN
INTRAMUSCULAR | Status: AC
  Filled 2020-07-15: qty 5

## 2020-07-15 MED ORDER — FENTANYL CITRATE (PF) 250 MCG/5ML IJ SOLN
INTRAMUSCULAR | Status: DC | PRN
  Administered 2020-07-15: 50 ug via INTRAVENOUS

## 2020-07-15 MED ORDER — LIDOCAINE HCL (PF) 2 % IJ SOLN
INTRAMUSCULAR | Status: AC
  Filled 2020-07-15: qty 5

## 2020-07-15 MED ORDER — HYDROCODONE-ACETAMINOPHEN 5-325 MG PO TABS: ORAL_TABLET | ORAL | 0 refills | Status: DC

## 2020-07-15 MED ORDER — CHLORHEXIDINE GLUCONATE 0.12 % MT SOLN: 15.0000 mL | Freq: Once | OROMUCOSAL | Status: DC

## 2020-07-15 MED ORDER — CEFAZOLIN SODIUM-DEXTROSE 2-4 GM/100ML-% IV SOLN
2.0000 g | INTRAVENOUS | Status: AC
  Administered 2020-07-15: 2 g via INTRAVENOUS
  Filled 2020-07-15: qty 100

## 2020-07-15 MED ORDER — 0.9 % SODIUM CHLORIDE (POUR BTL) OPTIME
TOPICAL | Status: DC | PRN
  Administered 2020-07-15: 1000 mL

## 2020-07-15 MED ORDER — FENTANYL CITRATE (PF) 100 MCG/2ML IJ SOLN
INTRAMUSCULAR | Status: AC
  Filled 2020-07-15: qty 2

## 2020-07-15 MED ORDER — PHENYLEPHRINE 40 MCG/ML (10ML) SYRINGE FOR IV PUSH (FOR BLOOD PRESSURE SUPPORT)
PREFILLED_SYRINGE | INTRAVENOUS | Status: DC | PRN
  Administered 2020-07-15: 160 ug via INTRAVENOUS
  Administered 2020-07-15 (×2): 120 ug via INTRAVENOUS

## 2020-07-15 MED ORDER — MIDAZOLAM HCL 2 MG/2ML IJ SOLN
INTRAMUSCULAR | Status: AC
  Filled 2020-07-15: qty 2

## 2020-07-15 MED ORDER — PROPOFOL 10 MG/ML IV BOLUS
INTRAVENOUS | Status: AC
  Filled 2020-07-15: qty 20

## 2020-07-15 MED ORDER — BUPIVACAINE HCL (PF) 0.25 % IJ SOLN
INTRAMUSCULAR | Status: AC
  Filled 2020-07-15: qty 30

## 2020-07-15 MED ORDER — PROPOFOL 10 MG/ML IV BOLUS
INTRAVENOUS | Status: DC | PRN
  Administered 2020-07-15: 10 mg via INTRAVENOUS
  Administered 2020-07-15: 30 mg via INTRAVENOUS

## 2020-07-15 MED ORDER — LIDOCAINE HCL (PF) 1 % IJ SOLN
INTRAMUSCULAR | Status: AC
  Filled 2020-07-15: qty 30

## 2020-07-15 MED ORDER — CHLORHEXIDINE GLUCONATE 0.12 % MT SOLN
OROMUCOSAL | Status: AC
  Administered 2020-07-15: 15 mL
  Filled 2020-07-15: qty 15

## 2020-07-15 SURGICAL SUPPLY — 67 items
BAG DECANTER FOR FLEXI CONT (MISCELLANEOUS) IMPLANT
BAND RUBBER #18 3X1/16 STRL (MISCELLANEOUS) ×3 IMPLANT
BLADE MINI RND TIP GREEN BEAV (BLADE) IMPLANT
BLADE SURG 15 STRL LF DISP TIS (BLADE) ×1 IMPLANT
BLADE SURG 15 STRL SS (BLADE) ×2
BNDG ELASTIC 3X5.8 VLCR STR LF (GAUZE/BANDAGES/DRESSINGS) ×3 IMPLANT
BNDG ESMARK 4X9 LF (GAUZE/BANDAGES/DRESSINGS) ×3 IMPLANT
BNDG GAUZE ELAST 4 BULKY (GAUZE/BANDAGES/DRESSINGS) IMPLANT
CANISTER SUCT 3000ML PPV (MISCELLANEOUS) ×3 IMPLANT
CHLORAPREP W/TINT 26 (MISCELLANEOUS) ×3 IMPLANT
CORD BIPOLAR FORCEPS 12FT (ELECTRODE) ×3 IMPLANT
COVER MAYO STAND STRL (DRAPES) ×3 IMPLANT
COVER SURGICAL LIGHT HANDLE (MISCELLANEOUS) ×3 IMPLANT
COVER WAND RF STERILE (DRAPES) ×3 IMPLANT
CUFF TOURN SGL QUICK 18X4 (TOURNIQUET CUFF) ×3 IMPLANT
DECANTER SPIKE VIAL GLASS SM (MISCELLANEOUS) IMPLANT
DRAPE MICROSCOPE LEICA (MISCELLANEOUS) ×3 IMPLANT
DRAPE MICROSCOPE LEICA 54X105 (DRAPES) ×3 IMPLANT
DRAPE SURG 17X23 STRL (DRAPES) ×3 IMPLANT
GAUZE SPONGE 4X4 12PLY STRL (GAUZE/BANDAGES/DRESSINGS) ×3 IMPLANT
GAUZE XEROFORM 1X8 LF (GAUZE/BANDAGES/DRESSINGS) ×3 IMPLANT
GLOVE BIO SURGEON STRL SZ7.5 (GLOVE) ×3 IMPLANT
GLOVE BIOGEL PI IND STRL 8 (GLOVE) ×1 IMPLANT
GLOVE BIOGEL PI INDICATOR 8 (GLOVE) ×2
GOWN STRL REUS W/ TWL LRG LVL3 (GOWN DISPOSABLE) ×1 IMPLANT
GOWN STRL REUS W/ TWL XL LVL3 (GOWN DISPOSABLE) ×1 IMPLANT
GOWN STRL REUS W/TWL LRG LVL3 (GOWN DISPOSABLE) ×2
GOWN STRL REUS W/TWL XL LVL3 (GOWN DISPOSABLE) ×2
KIT BASIN OR (CUSTOM PROCEDURE TRAY) ×3 IMPLANT
LOOP VESSEL MAXI BLUE (MISCELLANEOUS) IMPLANT
NEEDLE 18GX1X1/2 (RX/OR ONLY) (NEEDLE) IMPLANT
NEEDLE HYPO 25GX1X1/2 BEV (NEEDLE) ×3 IMPLANT
NEEDLE HYPO 25X1 1.5 SAFETY (NEEDLE) ×3 IMPLANT
NS IRRIG 1000ML POUR BTL (IV SOLUTION) ×3 IMPLANT
PACK ORTHO EXTREMITY (CUSTOM PROCEDURE TRAY) ×3 IMPLANT
PAD CAST 3X4 CTTN HI CHSV (CAST SUPPLIES) ×1 IMPLANT
PAD CAST 4YDX4 CTTN HI CHSV (CAST SUPPLIES) ×1 IMPLANT
PADDING CAST ABS 4INX4YD NS (CAST SUPPLIES)
PADDING CAST ABS COTTON 4X4 ST (CAST SUPPLIES) IMPLANT
PADDING CAST COTTON 3X4 STRL (CAST SUPPLIES) ×2
PADDING CAST COTTON 4X4 STRL (CAST SUPPLIES) ×2
PADDING UNDERCAST 2 STRL (CAST SUPPLIES) ×2
PADDING UNDERCAST 2X4 STRL (CAST SUPPLIES) ×1 IMPLANT
SPEAR EYE SURG WECK-CEL (MISCELLANEOUS) ×3 IMPLANT
SPLINT PLASTER CAST XFAST 3X15 (CAST SUPPLIES) IMPLANT
SPLINT PLASTER EXTRA FAST 3X15 (CAST SUPPLIES) ×2
SPLINT PLASTER GYPS XFAST 3X15 (CAST SUPPLIES) ×1 IMPLANT
SPLINT PLASTER XTRA FASTSET 3X (CAST SUPPLIES)
STOCKINETTE 4X48 STRL (DRAPES) ×3 IMPLANT
SUT ETHIBOND 3-0 V-5 (SUTURE) IMPLANT
SUT ETHILON 4 0 PS 2 18 (SUTURE) ×12 IMPLANT
SUT FIBERWIRE 4-0 18 TAPR NDL (SUTURE)
SUT MERSILENE 6 0 P 1 (SUTURE) IMPLANT
SUT NYLON 9 0 VRM6 (SUTURE) ×3 IMPLANT
SUT PROLENE 2 0 SH DA (SUTURE) ×3 IMPLANT
SUT PROLENE 6 0 P 1 18 (SUTURE) IMPLANT
SUT PROLENE 6 0 P 3 18 (SUTURE) ×3 IMPLANT
SUT SILK 4 0 PS 2 (SUTURE) ×6 IMPLANT
SUT SUPRAMID 4-0 (SUTURE) ×3 IMPLANT
SUT VICRYL 4-0 PS2 18IN ABS (SUTURE) IMPLANT
SUTURE FIBERWR 4-0 18 TAPR NDL (SUTURE) IMPLANT
SYR BULB EAR ULCER 3OZ GRN STR (SYRINGE) ×3 IMPLANT
SYR CONTROL 10ML LL (SYRINGE) ×3 IMPLANT
TOWEL GREEN STERILE FF (TOWEL DISPOSABLE) ×6 IMPLANT
TUBE CONNECTING 12'X1/4 (SUCTIONS) ×1
TUBE CONNECTING 12X1/4 (SUCTIONS) ×2 IMPLANT
UNDERPAD 30X36 HEAVY ABSORB (UNDERPADS AND DIAPERS) ×3 IMPLANT

## 2020-07-15 NOTE — Transfer of Care (Signed)
Immediate Anesthesia Transfer of Care Note  Patient: Frederich Montilla.  Procedure(s) Performed: LEFT INDEX , LONG,AND RING FINGER EXPLORATION ,REPAIR TENDON,ARTERY AND NERVE (Left Hand)  Patient Location: PACU  Anesthesia Type:MAC and Regional  Level of Consciousness: awake, alert  and oriented  Airway & Oxygen Therapy: Patient Spontanous Breathing  Post-op Assessment: Report given to RN and Post -op Vital signs reviewed and stable  Post vital signs: Reviewed and stable  Last Vitals:  Vitals Value Taken Time  BP    Temp    Pulse 54 07/15/20 1830  Resp    SpO2 100 % 07/15/20 1830  Vitals shown include unvalidated device data.  Last Pain:  Vitals:   07/15/20 1312  TempSrc:   PainSc: 0-No pain      Patients Stated Pain Goal: 3 (29/24/46 2863)  Complications: No complications documented.

## 2020-07-15 NOTE — Anesthesia Procedure Notes (Signed)
Anesthesia Regional Block: Axillary brachial plexus block   Pre-Anesthetic Checklist: ,, timeout performed, Correct Patient, Correct Site, Correct Laterality, Correct Procedure, Correct Position, site marked, Risks and benefits discussed,  Surgical consent,  Pre-op evaluation,  At surgeon's request and post-op pain management  Laterality: Left  Prep: chloraprep       Needles:  Injection technique: Single-shot  Needle Type: Echogenic Needle     Needle Length: 5cm  Needle Gauge: 21     Additional Needles:   Narrative:  Start time: 07/15/2020 3:02 PM End time: 07/15/2020 3:06 PM Injection made incrementally with aspirations every 5 mL.  Performed by: Personally  Anesthesiologist: Beryle Lathe, MD  Additional Notes: No pain on injection. No increased resistance to injection. Injection made in 5cc increments. Good needle visualization. Patient tolerated the procedure well.

## 2020-07-15 NOTE — H&P (Signed)
  Grant Ochoa. is an 64 y.o. male.   Chief Complaint: left finger lacerations HPI: 64 yo male states he sustained lacerations to left thumb/index/long/ring fingers on garage door 07/03/20.  Seen at [facility] where wounds cleaned and sutured.  He wishes to proceed with operative exploration of wounds with repair of tendon/artery/nerve as necessary.  Allergies:  Allergies  Allergen Reactions  . Atorvastatin Nausea Only    GI upset  . Varenicline Nausea Only    Chantix Crazy Dreams    Past Medical History:  Diagnosis Date  . Arthritis   . Coronary artery disease   . Hypertension   . Myocardial infarction [facility]) 2010   stent    Past Surgical History:  Procedure Laterality Date  . CORONARY ANGIOPLASTY  2010  . LIPOMA EXCISION  2012   forehead    Family History: History reviewed. No pertinent family history.  Social History:   reports that he has been smoking cigarettes. He has been smoking about 1.00 pack per day. He has never used smokeless tobacco. He reports previous alcohol use. He reports that he does not use drugs.  Medications: No medications prior to admission.    No results found for this or any previous visit (from the past 48 hour(s)).  No results found.   A comprehensive review of systems was negative.  There were no vitals taken for this visit.  General appearance: alert, cooperative and appears stated age Head: Normocephalic, without obvious abnormality, atraumatic Neck: supple, symmetrical, trachea midline Cardio: regular rate and rhythm Resp: clear to auscultation bilaterally Extremities: Intact sensation and capillary refill all digits except decreased in the index and long fingers and radial ring finger.  +epl/fpl/io.  Lacerations volarly at index, long, ring fingers and on ulnar side of thumb. Pulses: 2+ and symmetric Skin: Skin color, texture, turgor normal. No rashes or lesions Neurologic: Grossly normal Incision/Wound: as  above  Assessment/Plan Left hand lacerations.  Plan exploration with repair of tendon/artery/nerve as necessary.  He states his thumb feels normal and he does not want exploration of that wound.  Risks, benefits, and alternatives of surgery have been discussed and the patient agrees with the plan of care.   Betha Loa 07/15/2020, 11:37 AM

## 2020-07-15 NOTE — Progress Notes (Signed)
Called Dr. Jean Rosenthal regarding patients elevated blood pressure of 195/114. Pt states he is anxious and has not smoked all day which is probably a contributing factor.

## 2020-07-15 NOTE — Discharge Instructions (Signed)

## 2020-07-15 NOTE — Op Note (Signed)
I assisted Surgeon(s) and Role:    * Betha Loa, MD - Primary    * Cindee Salt, MD on the Procedure(s): LEFT INDEX , LONG,AND RING FINGER EXPLORATION ,REPAIR TENDON,ARTERY AND NERVE on 07/15/2020.  I provided assistance on this case as follows: Set up approach debridement of index middle ring finger lacerations with exploration of arteries nerves and each both radial and ulnar with repair of profundus tendon of the index profundus tendon of the middle repair of radial digital artery and ulnar digital nerve of the ring finger using microsurgical technique repair ulnar digital artery and ulnar digital nerve using microsurgical techniques with exploration of radial digital artery and nerve middle finger repair of ulnar digital artery and nerve using microsurgical techniques of the index finger with exploration of radial digital artery and nerve index finger closure of the wounds and application of dressing and splints  Electronically signed by: Cindee Salt, MD Date: 07/15/2020 Time: 6:24 PM

## 2020-07-15 NOTE — Op Note (Addendum)
NAME: Grant Ochoa. MEDICAL RECORD NO: [id number] DATE OF BIRTH: 10-05-1955 FACILITY: [facility] LOCATION: [facility] OR PHYSICIAN: Tami Ribas, MD   OPERATIVE REPORT   DATE OF PROCEDURE: 07/15/20    PREOPERATIVE DIAGNOSIS:   Left thumb index long and ring finger lacerations   POSTOPERATIVE DIAGNOSIS:   1.  Left thumb laceration 2.  Left index finger FDP laceration 3.  Left index finger ulnar digital nerve laceration 4.  Left index finger ulnar digital artery laceration 5.  Left long finger FDP laceration 6.  Left long finger ulnar digital nerve laceration 7.  Left long finger ulnar digital artery laceration 8.  Left ring finger FDP partial laceration 9.  Left ring finger radial digital nerve laceration 10.  Left ring finger radial digital artery laceration 11.  Left ring finger ulnar digital nerve laceration 12.  Left ring finger ulnar digital artery laceration   PROCEDURE:   1.  Left index finger repair FDP zone 1 laceration 2.  Left index finger repair ulnar digital nerve laceration under microscope 3.  Left index finger repair ulnar digital artery laceration under microscope 4.  Left long finger repair FDP zone 1 laceration 5.  Left long finger repair ulnar digital nerve laceration under microscope 6.  Left long finger repair ulnar digital artery laceration under microscope 7.  Left ring finger repair radial digital artery laceration under microscope 8.  Left ring finger repair ulnar digital nerve laceration under microscope    SURGEON:  Betha Loa, M.D.   ASSISTANT: Cindee Salt, MD   ANESTHESIA:  Regional with sedation   INTRAVENOUS FLUIDS:  Per anesthesia flow sheet.   ESTIMATED BLOOD LOSS:  Minimal.   COMPLICATIONS:  None.   SPECIMENS:  none   TOURNIQUET TIME:    Total Tourniquet Time Documented: Upper Arm (Left) - 155 minutes Total: Upper Arm (Left) - 155 minutes    DISPOSITION:  Stable to PACU.   INDICATIONS: 64 year old male states he sustained  lacerations to the left thumb index long and ring fingers July 03, 2020 on his garage door.  He was seen at [facility] where the wounds were cleaned and sutured.  He followed up in the office.  Decreased sensation in the tips of the fingers and inability to flex at the DIP joint of the index and long fingers.  He wishes to proceed with operative exploration and repair of tendon artery nerve is necessary. Risks, benefits and alternatives of surgery were discussed including the risks of blood loss, infection, damage to nerves, vessels, tendons, ligaments, bone for surgery, need for additional surgery, complications with wound healing, continued pain, stiffness.  He voiced understanding of these risks and elected to proceed.  OPERATIVE COURSE:  After being identified preoperatively by myself,  the patient and I agreed on the procedure and site of the procedure.  The surgical site was marked.  Surgical consent had been signed. He was given IV antibiotics as preoperative antibiotic prophylaxis. He was transferred to the operating room and placed on the operating table in supine position with the Left upper extremity on an arm board.  Sedation was induced by the anesthesiologist. A regional block had been performed by anesthesia in preoperative holding.   Left upper extremity was prepped and draped in normal sterile orthopedic fashion.  A surgical pause was performed between the surgeons, anesthesia, and operating room staff and all were in agreement as to the patient, procedure, and site of procedure.  Tourniquet at the proximal aspect  of the extremity was inflated to 250 mmHg after exsanguination of the arm with an Esmarch bandage.    The sutures were removed.  The wounds were explored.  In the ring finger there was laceration of the radial and ulnar digital nerve and artery distal to the trifurcation.  There was a partial laceration of the FDP tendon with greater than 50% of the tendon footprint  intact.  In the ring finger there was laceration of the FDP tendon in zone 1 distal to the DIP joint.  The radial digital nerve and artery were intact.  The ulnar digital nerve and artery were lacerated.  In the index finger there was laceration of the FDP tendon in zone 1 proximal to the DIP joint.  The radial digital nerve and artery were intact.  The ulnar digital nerve and artery were lacerated.  The FDP tendon in the ring finger was able to be retrieved through the sheath.  It was distal enough to repair over a button.  A 2-0 Prolene suture was used.  This was passed in a pullout fashion through the tendon and then passed around the distal phalanx and out dorsally through the nail.  This was tied toward the end of the case.  In the index finger there was enough distal stump of tendon remaining to perform direct repair.  The proximal stump of the FDP tendon was able to be retrieved and passed back through the flexor sheath.  A 4-0 looped Supramid U was used in a modified Kessler technique.  The dorsal aspect of the tendon was sutured with a running epitendinous 6-0 Prolene suture.  The remaining portion of the suture was thrown through the distal stump of tendon in the suture tied with good approximation of the tendon.  The 6-0 Prolene was used to perform a running epitendinous suture along the palmar side of the tendon.  The microscope was brought in.  This was used for microdissection of the ulnar digital nerve and artery of the index finger.  The radial digital nerve and artery were identified and were confirmed to be intact.  The lumen of the ulnar digital artery was cleared.  The ulnar digital artery was repaired with a 9-0 nylon suture in a interrupted circumferential fashion.  The ulnar digital nerve was repaired again with a 9-0 nylon suture in an interrupted circumferential fashion.  Good reapproximation was obtained.  In the ring finger the microscope was again used for microdissection.  The radial  digital nerve and artery were identified and were intact.  The ulnar digital nerve and artery were lacerated.  This laceration was distal to the trifurcation.  A branch of the ulnar digital artery was identified.  The lumen was cleared.  The 9-0 nylon suture was used in an interrupted circumferential fashion to reapproximate the arterial ends.  2 branches of the ulnar digital nerve were then reapproximated with a 9-0 nylon suture in an interrupted circumferential fashion.  Good reapproximation was obtained.  The microscope was then used for microdissection and the ring finger.  There were no repairable nerve branches on the radial side which was distal to the trifurcation.  A branch of the radial digital artery was able to be identified.  The lumen was cleared and the 9-0 nylon suture used in an interrupted circumferential fashion to reapproximate the arterial ends.  On the ulnar side there was no repairable artery.  The branches of the ulnar digital nerve were reapproximated using a 9-0 nylon suture.  The wounds were copiously irrigated with sterile saline.  The Prolene suture was tied over the button on the dorsum of the ring finger.  Good reapproximation of the tendon ends to the tendon stump was obtained.  The wounds were closed with 4-0 nylon in a horizontal mattress fashion.  The wounds were dressed with sterile Xeroform and 4 x 4's and wrapped with a Kerlix bandage.  The tourniquet was deflated at 155 minutes.  Fingertips were pink with brisk capillary refill after deflation of tourniquet.    A dorsal blocking splint was placed with the wrist at approximately 30 degrees flexion with the MPs flexed and the IP is in resting position.  This was wrapped with Kerlix and Ace bandage.  The operative  drapes were broken down.  The patient was awoken from anesthesia safely.  He was transferred back to the stretcher and taken to PACU in stable condition.  I will see him back in the office in 1 week for postoperative  followup.  I will give him a prescription for Norco 5/325 1-2 tabs PO q6 hours prn pain, dispense # 20.   Betha Loa, MD Electronically signed, 07/15/20

## 2020-07-15 NOTE — Interval H&P Note (Signed)
History and Physical Interval Note:  07/15/2020 3:13 PM  Grant Ochoa.  has presented today for surgery, with the diagnosis of LEFT THUMB AND INDEX LONG AND RING FINGER LACERATION.  The various methods of treatment have been discussed with the patient and family. After consideration of risks, benefits and other options for treatment, the patient has consented to  Procedure(s): LEFT INDEX , LONG,AND RING FINGER EXPLORATION ,REPAIR TENDON,ARTERY AND NERVE AS NECESSARY (Left) as a surgical intervention.  The patient's history has been reviewed, patient examined, no change in status, stable for surgery.  I have reviewed the patient's chart and labs.  Questions were answered to the patient's satisfaction.     Betha Loa

## 2020-07-15 NOTE — Anesthesia Postprocedure Evaluation (Signed)
Anesthesia Post Note  Patient: Yoshito Gaza.  Procedure(s) Performed: LEFT INDEX , LONG,AND RING FINGER EXPLORATION ,REPAIR TENDON,ARTERY AND NERVE (Left Hand)     Patient location during evaluation: PACU Anesthesia Type: Regional Level of consciousness: awake and alert Pain management: pain level controlled Vital Signs Assessment: post-procedure vital signs reviewed and stable Respiratory status: spontaneous breathing and respiratory function stable Cardiovascular status: stable Postop Assessment: no apparent nausea or vomiting Anesthetic complications: no   No complications documented.  Last Vitals:  Vitals:   07/15/20 1845 07/15/20 1900  BP: (!) 163/96 (!) 176/81  Pulse: 60 (!) 58  Resp: 14 14  Temp:    SpO2: 100% 100%    Last Pain:  Vitals:   07/15/20 1900  TempSrc:   PainSc: 0-No pain                 Lurine Imel DANIEL

## 2020-07-16 ENCOUNTER — Encounter (HOSPITAL_COMMUNITY): Payer: Self-pay | Admitting: Orthopedic Surgery

## 2020-09-25 NOTE — Progress Notes (Signed)
[name] [address] [place] [address] Phone: [phone] Subjective:   I Grant Ochoa am serving as a Neurosurgeon for Dr. Antoine Primas.  This visit occurred during the SARS-CoV-2 public health emergency.  Safety protocols were in place, including screening questions prior to the visit, additional usage of staff PPE, and extensive cleaning of exam room while observing appropriate contact time as indicated for disinfecting solutions.   I'm seeing this patient by the request  of:  Tarri Fuller, MD  CC: Left shoulder pain follow-up  WNI:[id number]   06/26/2020 Patient is doing relatively well at this time.  We discussed that patient still has some pathology that seems to be more labral in nature and an MRI would further evaluate this.  Patient though would not want any surgical intervention and seems to be doing relatively well.  Discussed with patient about icing regimen and home exercises.  Discussed avoiding certain activities.  Patient wants to follow-up again in 3 months.  Understands at that time we will consider the possibility of going to imaging if necessary.  Update 09/26/2020 Grant Ochoa. is a 65 y.o. male coming in with complaint of left shoulder pain. Since last visit, patient has undergone left hand, index finger repair after an accident where he had laceration of flexor muscle after grabbing onto garage door rail. Also developed infection and had to have nail plate removed. Patient states he is still having some issues. It is not worse but the pain lingers. States he still has pain at night. Laying on his stomach and back increases pain.       Past Medical History:  Diagnosis Date  . Arthritis   . Coronary artery disease   . Hypertension   . Myocardial infarction [facility]) 2010   stent   Past Surgical History:  Procedure Laterality Date  . ARTERY AND TENDON REPAIR Left 07/15/2020   Procedure: LEFT INDEX , LONG,AND RING  FINGER EXPLORATION ,REPAIR TENDON,ARTERY AND NERVE;  Surgeon: Betha Loa, MD;  Location: [facility] OR;  Service: Orthopedics;  Laterality: Left;  . CORONARY ANGIOPLASTY  2010  . LIPOMA EXCISION  2012   forehead   Social History   Socioeconomic History  . Marital status: Married    Spouse name: Not on file  . Number of children: Not on file  . Years of education: Not on file  . Highest education level: Not on file  Occupational History  . Not on file  Tobacco Use  . Smoking status: Current Every Day Smoker    Packs/day: 1.00    Types: Cigarettes  . Smokeless tobacco: Never Used  Vaping Use  . Vaping Use: Some days  Substance and Sexual Activity  . Alcohol use: Not Currently  . Drug use: Never  . Sexual activity: Not on file  Other Topics Concern  . Not on file  Social History Narrative  . Not on file   Social Determinants of Health   Financial Resource Strain: Not on file  Food Insecurity: Not on file  Transportation Needs: Not on file  Physical Activity: Not on file  Stress: Not on file  Social Connections: Not on file   Allergies  Allergen Reactions  . Atorvastatin Nausea Only    GI upset  . Varenicline Nausea Only    Chantix Crazy Dreams   History reviewed. No pertinent family history.   Current Outpatient Medications (Cardiovascular):  .  metoprolol tartrate (LOPRESSOR) 25 MG tablet, Take 25 mg by  mouth daily. Marland Kitchen  omega-3 acid ethyl esters (LOVAZA) 1 g capsule, Take 1 g by mouth daily.  Current Outpatient Medications (Respiratory):  .  loratadine (CLARITIN) 10 MG tablet, Take 10 mg by mouth daily as needed for allergies.  Current Outpatient Medications (Analgesics):  .  aspirin EC 81 MG tablet, Take 81 mg by mouth at bedtime. Swallow whole. Marland Kitchen  HYDROcodone-acetaminophen (NORCO) 5-325 MG tablet, 1-2 tabs po q6 hours prn pain .  HYDROcodone-acetaminophen (NORCO/VICODIN) 5-325 MG tablet, Take 1 tablet by mouth every 4 (four) hours as needed. .  naproxen sodium  (ALEVE) 220 MG tablet, Take 440 mg by mouth daily as needed (pain).   Current Outpatient Medications (Other):  .  gabapentin (NEURONTIN) 100 MG capsule, Take 2 capsules (200 mg total) by mouth at bedtime.   Reviewed prior external information including notes and imaging from  primary care provider As well as notes that were available from care everywhere and other healthcare systems.  Past medical history, social, surgical and family history all reviewed in electronic medical record.  No pertanent information unless stated regarding to the chief complaint.   Review of Systems:  No headache, visual changes, nausea, vomiting, diarrhea, constipation, dizziness, abdominal pain, skin rash, fevers, chills, night sweats, weight loss, swollen lymph nodes, body aches, joint swelling, chest pain, shortness of breath, mood changes. POSITIVE muscle aches  Objective  Blood pressure 110/80, pulse 86, height 5\' 10"  (1.778 m), weight 230 lb (104.3 kg), SpO2 96 %.   General: No apparent distress alert and oriented x3 mood and affect normal, dressed appropriately.  HEENT: Pupils equal, extraocular movements intact  Respiratory: Patient's speak in full sentences and does not appear short of breath  Cardiovascular: No lower extremity edema, non tender, no erythema  Gait normal with good balance and coordination.  MSK: Patient does have postsurgical changes of the hand noted.  Does have weakness and decrease in range of motion of the second and third fingers of the left hand with decreasing strength. Patient's left shoulder does have positive crossover.  Positive impingement.  Rotator cuff strength though is improved with 4+ out of 5 strength.  Contralateral side is 5 out of 5 strength. Neck exam does have loss of lordosis.  Procedure: Real-time Ultrasound Guided Injection of left glenohumeral joint Device: GE Logiq E  Ultrasound guided injection is preferred based studies that show increased duration,  increased effect, greater accuracy, decreased procedural pain, increased response rate with ultrasound guided versus blind injection.  Verbal informed consent obtained.  Time-out conducted.  Noted no overlying erythema, induration, or other signs of local infection.  Skin prepped in a sterile fashion.  Local anesthesia: Topical Ethyl chloride.  With sterile technique and under real time ultrasound guidance:  Joint visualized.  21g 2 inch needle inserted posterior approach. Pictures taken for needle placement. Patient did have injection of 2 cc of 0.5% Marcaine, and 1cc of Kenalog 40 mg/dL. Completed without difficulty  Pain immediately resolved suggesting accurate placement of the medication.  Advised to call if fevers/chills, erythema, induration, drainage, or persistent bleeding.   Impression: Technically successful ultrasound guided injection.  Procedure: Real-time Ultrasound Guided Injection of left acromioclavicular joint Device: GE Logiq Q7 Ultrasound guided injection is preferred based studies that show increased duration, increased effect, greater accuracy, decreased procedural pain, increased response rate, and decreased cost with ultrasound guided versus blind injection.  Verbal informed consent obtained.  Time-out conducted.  Noted no overlying erythema, induration, or other signs of local infection.  Skin  prepped in a sterile fashion.  Local anesthesia: Topical Ethyl chloride.  With sterile technique and under real time ultrasound guidance: With a 25-gauge half inch needle injected with 0.5 cc of 0.5% Marcaine and 0.5 cc of Kenalog 40 mg/mL Completed without difficulty  Pain immediately resolved suggesting accurate placement of the medication.  Advised to call if fevers/chills, erythema, induration, drainage, or persistent bleeding.  Impression: Technically successful ultrasound guided injection.    Impression and Recommendations:     The above documentation has been  reviewed and is accurate and complete Judi Saa, DO

## 2020-09-26 ENCOUNTER — Ambulatory Visit: Payer: Self-pay

## 2020-09-26 ENCOUNTER — Encounter: Payer: Self-pay | Admitting: Family Medicine

## 2020-09-26 ENCOUNTER — Other Ambulatory Visit: Payer: Self-pay

## 2020-09-26 ENCOUNTER — Ambulatory Visit (INDEPENDENT_AMBULATORY_CARE_PROVIDER_SITE_OTHER): Payer: BC Managed Care – PPO | Admitting: Family Medicine

## 2020-09-26 VITALS — BP 110/80 | HR 86 | Ht 70.0 in | Wt 230.0 lb

## 2020-09-26 DIAGNOSIS — G8929 Other chronic pain: Secondary | ICD-10-CM | POA: Diagnosis not present

## 2020-09-26 DIAGNOSIS — M25512 Pain in left shoulder: Secondary | ICD-10-CM

## 2020-09-26 DIAGNOSIS — M75102 Unspecified rotator cuff tear or rupture of left shoulder, not specified as traumatic: Secondary | ICD-10-CM | POA: Diagnosis not present

## 2020-09-26 DIAGNOSIS — M19012 Primary osteoarthritis, left shoulder: Secondary | ICD-10-CM | POA: Diagnosis not present

## 2020-09-26 NOTE — Assessment & Plan Note (Signed)
Repeat injection given again today.  We did discuss about again the advanced imaging and did not consider it.  Patient states though that unfortunately because of his recent hand surgery and pain he would like to get through this before he would consider anything else.  Patient does feel like he has made progress.  We discussed continuing the gabapentin.  Discussed other medications that could be helpful as well.  Hoping that patient does make good progress.  Follow-up again in 6 to 12 weeks.  Once again I will encourage advanced imaging if he continues to have difficulty

## 2020-09-26 NOTE — Patient Instructions (Signed)
Good to see you Injections today Continue the exercises Still need to consider MRI you declined today but if you don't make significant improvement  See me again in 6-12 weeks

## 2020-09-26 NOTE — Assessment & Plan Note (Signed)
Patient given injection and tolerated the procedure well.  Discussed which activities to doing which one to avoid.  Patient has not had any injection in this area but did have swelling on ultrasound.  Follow-up again in 6 to 12 weeks

## 2020-12-17 ENCOUNTER — Ambulatory Visit: Payer: BC Managed Care – PPO | Admitting: Family Medicine

## 2021-11-24 DIAGNOSIS — F411 Generalized anxiety disorder: Secondary | ICD-10-CM | POA: Diagnosis not present

## 2021-11-24 DIAGNOSIS — E782 Mixed hyperlipidemia: Secondary | ICD-10-CM | POA: Diagnosis not present

## 2021-11-24 DIAGNOSIS — I10 Essential (primary) hypertension: Secondary | ICD-10-CM | POA: Diagnosis not present

## 2021-11-24 DIAGNOSIS — R739 Hyperglycemia, unspecified: Secondary | ICD-10-CM | POA: Diagnosis not present

## 2022-01-22 DIAGNOSIS — R200 Anesthesia of skin: Secondary | ICD-10-CM | POA: Diagnosis not present

## 2022-01-22 DIAGNOSIS — R251 Tremor, unspecified: Secondary | ICD-10-CM | POA: Diagnosis not present

## 2022-01-22 DIAGNOSIS — R5383 Other fatigue: Secondary | ICD-10-CM | POA: Diagnosis not present

## 2022-01-22 DIAGNOSIS — R202 Paresthesia of skin: Secondary | ICD-10-CM | POA: Diagnosis not present

## 2022-05-13 DIAGNOSIS — R55 Syncope and collapse: Secondary | ICD-10-CM | POA: Diagnosis not present

## 2022-05-13 DIAGNOSIS — R42 Dizziness and giddiness: Secondary | ICD-10-CM | POA: Diagnosis not present

## 2022-05-13 DIAGNOSIS — J209 Acute bronchitis, unspecified: Secondary | ICD-10-CM | POA: Diagnosis not present

## 2022-05-13 DIAGNOSIS — I4510 Unspecified right bundle-branch block: Secondary | ICD-10-CM | POA: Diagnosis not present

## 2022-05-13 DIAGNOSIS — I440 Atrioventricular block, first degree: Secondary | ICD-10-CM | POA: Diagnosis not present

## 2022-06-04 DIAGNOSIS — R739 Hyperglycemia, unspecified: Secondary | ICD-10-CM | POA: Diagnosis not present

## 2022-06-04 DIAGNOSIS — Z0000 Encounter for general adult medical examination without abnormal findings: Secondary | ICD-10-CM | POA: Diagnosis not present

## 2022-06-08 DIAGNOSIS — I10 Essential (primary) hypertension: Secondary | ICD-10-CM | POA: Diagnosis not present

## 2022-06-08 DIAGNOSIS — E119 Type 2 diabetes mellitus without complications: Secondary | ICD-10-CM | POA: Diagnosis not present

## 2022-06-08 DIAGNOSIS — Z0000 Encounter for general adult medical examination without abnormal findings: Secondary | ICD-10-CM | POA: Diagnosis not present

## 2022-06-08 DIAGNOSIS — I2510 Atherosclerotic heart disease of native coronary artery without angina pectoris: Secondary | ICD-10-CM | POA: Diagnosis not present

## 2022-06-08 DIAGNOSIS — N400 Enlarged prostate without lower urinary tract symptoms: Secondary | ICD-10-CM | POA: Diagnosis not present

## 2022-06-11 IMAGING — DX DG HAND COMPLETE 3+V*R*
3 series · 3 of 3 positions shown · non-contrast
Comparison: None.

CLINICAL DATA: Finger lacerations.

EXAM:
RIGHT HAND - COMPLETE 3+ VIEW

[hand ap]
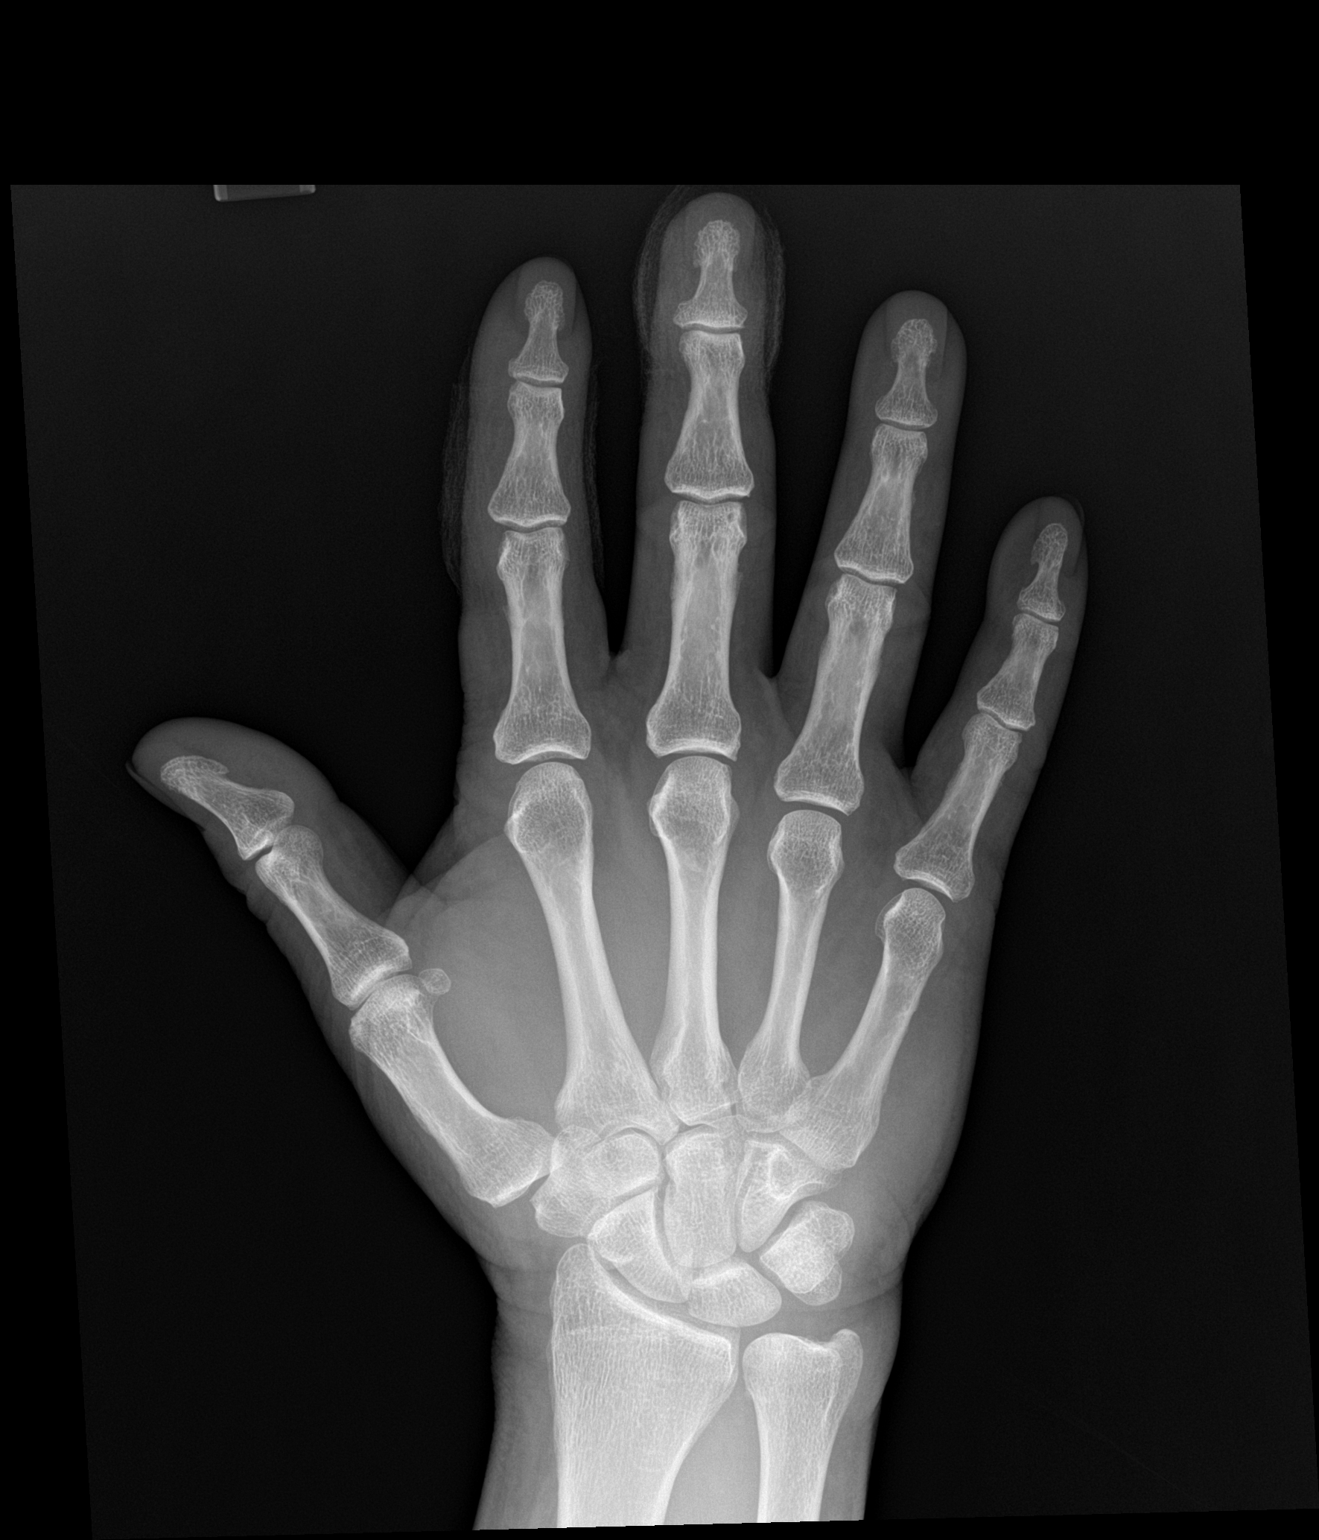

[hand obl]
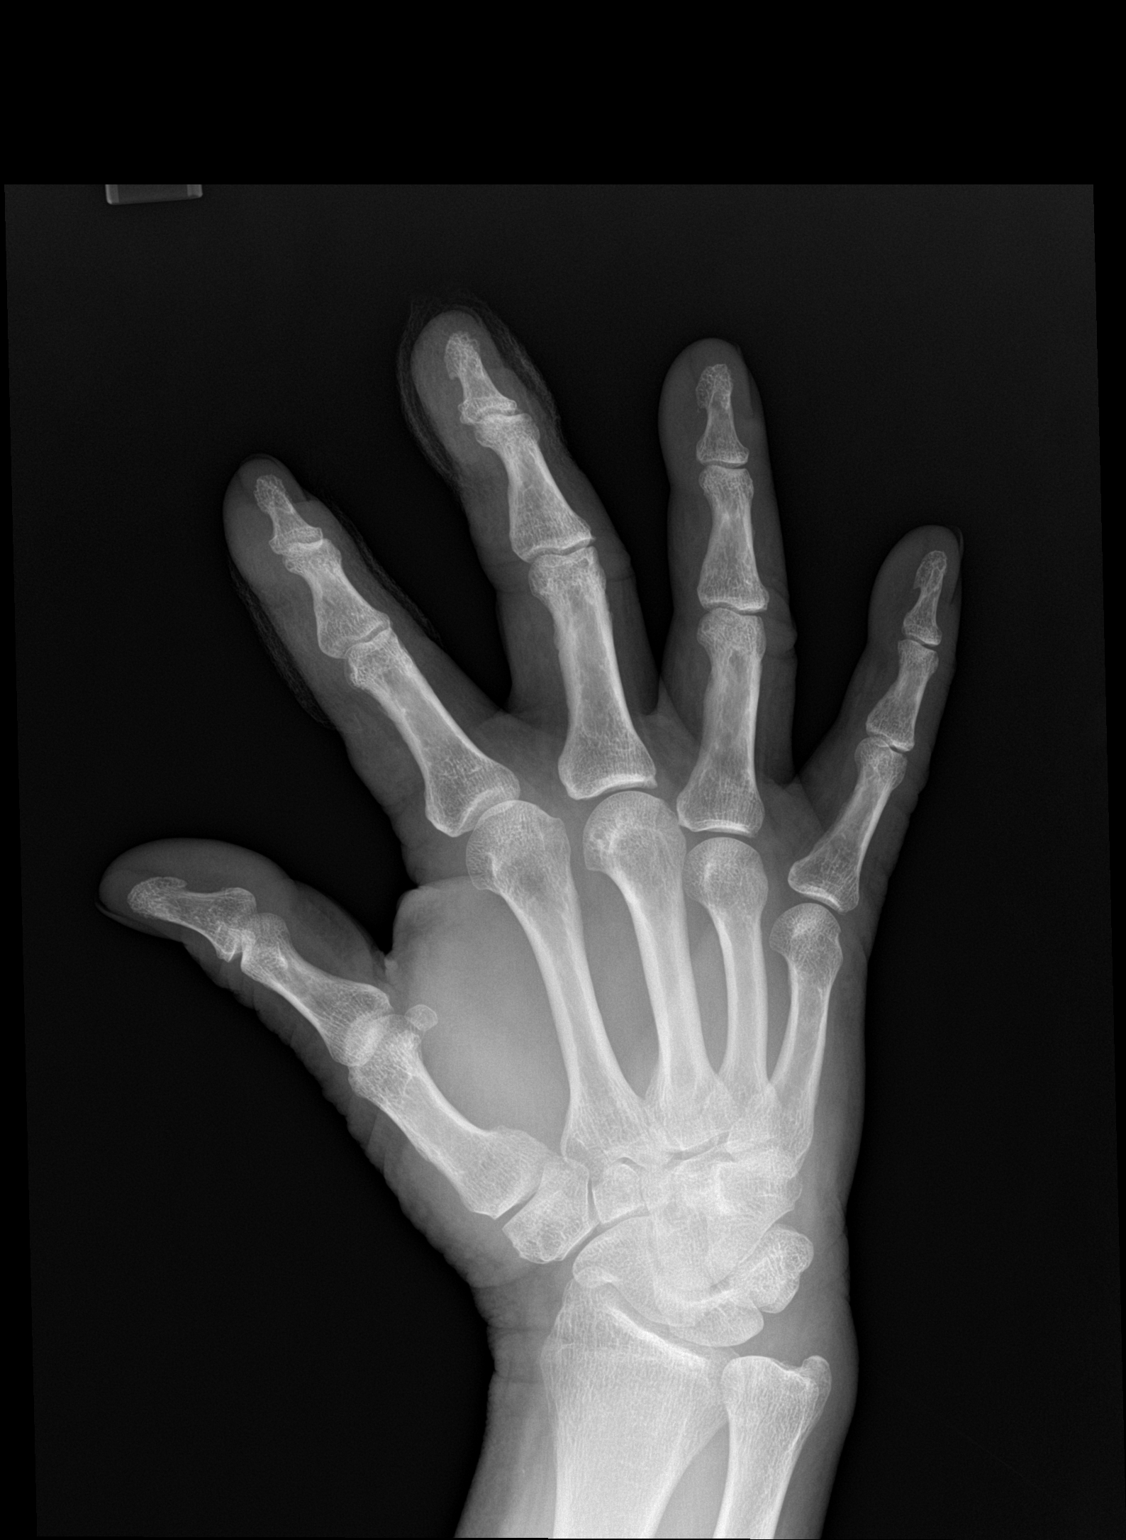

[hand lat]
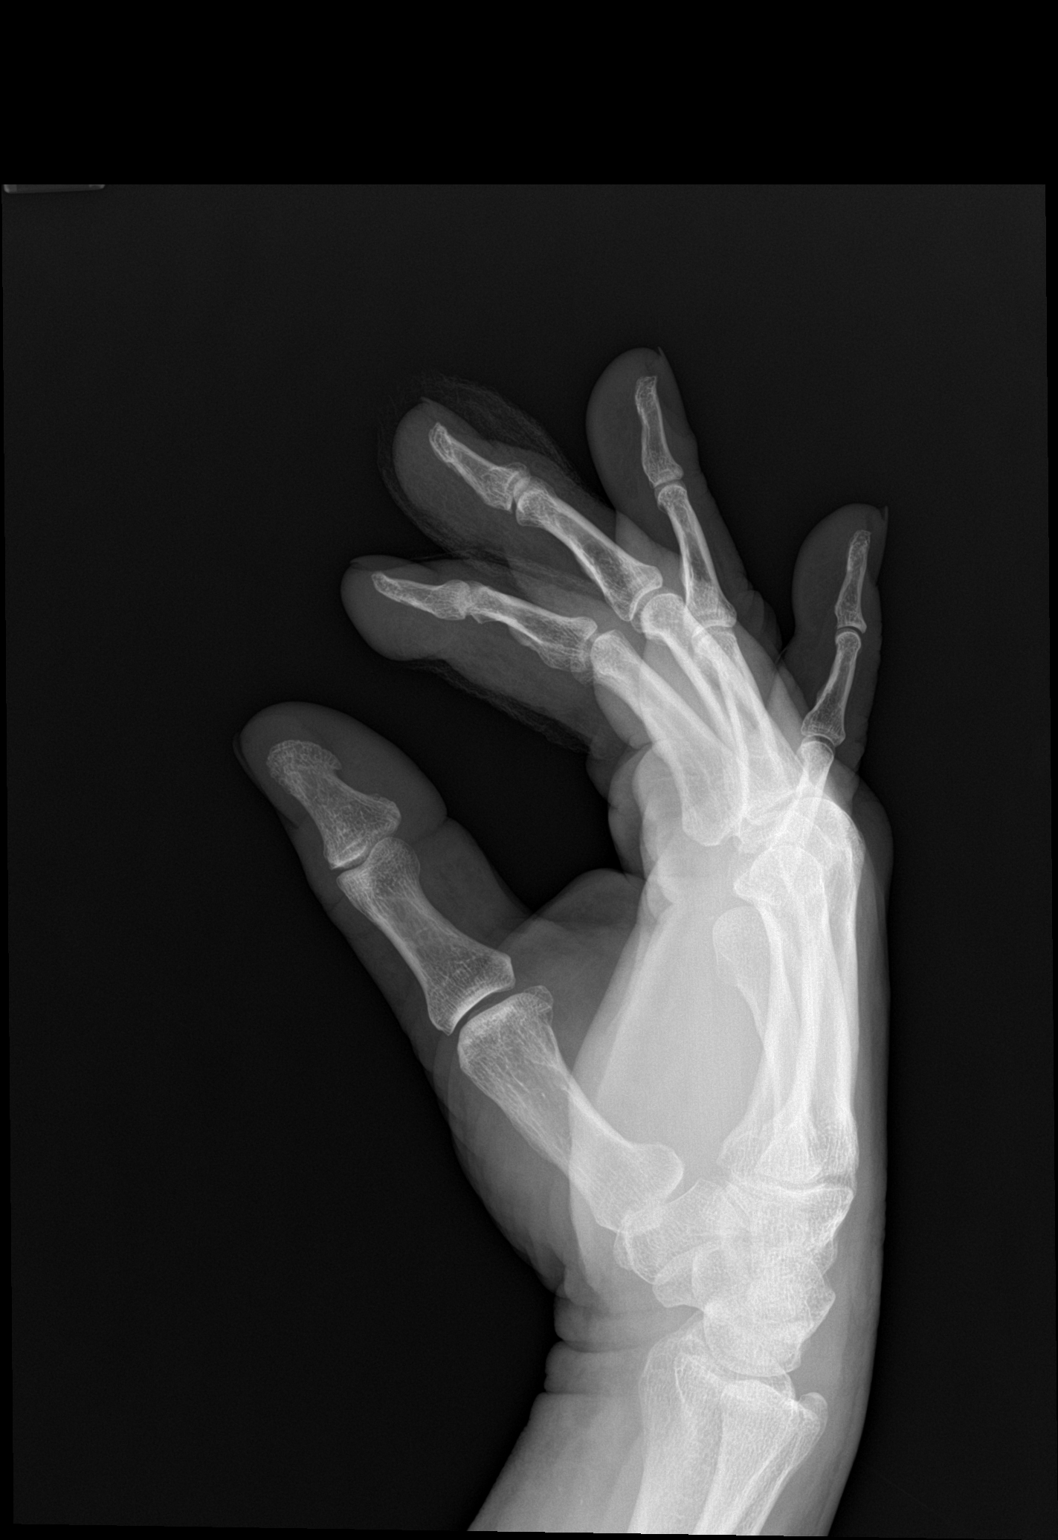

[3 of 3 positions shown; findings below may reference images not displayed]

FINDINGS: Bandage material overlies the second and third digits. No underlying
fracture or dislocation identified. No radiopaque foreign bodies.
IMPRESSION: 1. No acute findings.
2. No radiopaque foreign bodies identified.

## 2022-06-14 NOTE — Progress Notes (Signed)
CARDIOLOGY CONSULT NOTE       Patient ID: Grant Ochoa. MRN: [id number] DOB/AGE: 1956/02/06 66 y.o.  Admit date: (Not on file) Referring Physician: Bebe Shaggy Primary Physician: Rubie Maid, MD Primary Cardiologist: New Reason for Consultation: Lightheadedness   HPI:  66 y.o. 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   ROS All other systems reviewed and negative except as noted above  Past Medical History:  Diagnosis Date   Arthritis    BPH (benign prostatic hyperplasia)    CAD (coronary artery disease)    Coronary artery disease    GAD (generalized anxiety disorder)    Hyperglycemia    Hypertension    Lightheadedness    Myocardial infarction ([place]) 2010   stent   Syncope    Tremor     Family History  Problem Relation Age of Onset   Hypertension Mother    Hypertension Father    Aortic aneurysm Father     Social History   Socioeconomic History   Marital status: Married    Spouse  name: Not on file   Number of children: Not on file   Years of education: Not on file   Highest education level: Not on file  Occupational History   Not on file  Tobacco Use   Smoking status: Every Day    Packs/day: 1.00    Types: Cigarettes   Smokeless tobacco: Never  Vaping Use   Vaping Use: Some days  Substance and Sexual Activity   Alcohol use: Not Currently   Drug use: Never   Sexual activity: Not on file  Other Topics Concern   Not on file  Social History Narrative   Not on file   Social Determinants of Health   Financial Resource Strain: Not on file  Food Insecurity: Not on file  Transportation Needs: Not on file  Physical Activity: Not on file  Stress: Not on file  Social Connections: Not on file  Intimate Partner Violence: Not on file    Past Surgical History:  Procedure Laterality Date   ARTERY AND TENDON REPAIR Left 07/15/2020   Procedure: LEFT INDEX , LONG,AND RING FINGER EXPLORATION ,REPAIR TENDON,ARTERY AND NERVE;  Surgeon: Leanora Cover, MD;  Location: [place];  Service: Orthopedics;  Laterality: Left;   CORONARY ANGIOPLASTY  2010   LIPOMA EXCISION  2012   forehead      Current Outpatient Medications:  aspirin EC 81 MG tablet, Take 81 mg by mouth at bedtime. Swallow whole., Disp: , Rfl:    ezetimibe (ZETIA) 10 MG tablet, Take 10 mg by mouth daily., Disp: , Rfl:    FARXIGA 10 MG TABS tablet, Take 10 mg by mouth daily., Disp: , Rfl:    gabapentin (NEURONTIN) 100 MG capsule, Take 2 capsules (200 mg total) by mouth at bedtime., Disp: 180 capsule, Rfl: 3   loratadine (CLARITIN) 10 MG tablet, Take 10 mg by mouth daily as needed for allergies., Disp: , Rfl:    metoprolol tartrate (LOPRESSOR) 25 MG tablet, Take 25 mg by mouth daily., Disp: , Rfl:    naproxen sodium (ALEVE) 220 MG tablet, Take 440 mg by mouth daily as needed (pain)., Disp: , Rfl:    omega-3 acid ethyl esters (LOVAZA) 1 g capsule, Take 1 g by mouth daily., Disp: , Rfl:     Physical  Exam: Blood pressure (!) 148/70, pulse 72, height 5\' 10"  (1.778 m), weight 225 lb (102.1 kg), SpO2 98 %.    Affect appropriate Chronically ill male HEENT: normal Neck supple with no adenopathy JVP normal bilateral bruits no thyromegaly Lungs clear with no wheezing and good diaphragmatic motion Heart:  S1/S2 SEM murmur, no rub, gallop or click PMI normal Abdomen: benighn, BS positve, no tenderness, no AAA no bruit.  No HSM or HJR Distal pulses palpable  No edema Neuro non-focal Skin warm and dry No muscular weakness  Labs:   Lab Results  Component Value Date   WBC 7.3 07/15/2020   HGB 16.3 07/15/2020   HCT 49.4 07/15/2020   MCV 93.4 07/15/2020   PLT 290 07/15/2020   No results for input(s): "NA", "K", "CL", "CO2", "BUN", "CREATININE", "CALCIUM", "PROT", "BILITOT", "ALKPHOS", "ALT", "AST", "GLUCOSE" in the last 168 hours.  Invalid input(s): "LABALBU" No results found for: "CKTOTAL", "CKMB", "CKMBINDEX", "TROPONINI" No results found for: "CHOL" No results found for: "HDL" No results found for: "LDLCALC" No results found for: "TRIG" No results found for: "CHOLHDL" No results found for: "LDLDIRECT"    Radiology: No results found.  EKG: SR rate 72 LAD RBBB PR 212 msec ? Old IMI    ASSESSMENT AND PLAN:   CAD:  distant stent to RCA in 2010 Given ongoing risk factors and tightness in chest with activity will order  Lexiscan Myovue HLD:  refer to lipid clinic needs to be on low dose statin and PSK9 Smoking: counseled on cessation < 10 minutes Lung cancer CT Dizziness: does not sound like arrhythmia check carotids and monitor? More like vertigo  HTN:  continue current meds Bruit:  loud bilateral carotid duplex   Will need to screen for PVD and possibly assess 2/6 SEM next visit    Ex myovue Carotids Refer lipid clinic for PSK9 Lung cancer CT   F/U in 3 months   Signed: 2011 06/28/2022, 2:45 PM

## 2022-06-24 DIAGNOSIS — Z1211 Encounter for screening for malignant neoplasm of colon: Secondary | ICD-10-CM | POA: Diagnosis not present

## 2022-06-24 DIAGNOSIS — Z1212 Encounter for screening for malignant neoplasm of rectum: Secondary | ICD-10-CM | POA: Diagnosis not present

## 2022-06-28 ENCOUNTER — Ambulatory Visit: Payer: BC Managed Care – PPO | Attending: Cardiovascular Disease | Admitting: Cardiovascular Disease

## 2022-06-28 ENCOUNTER — Encounter: Payer: Self-pay | Admitting: Cardiovascular Disease

## 2022-06-28 VITALS — BP 148/70 | HR 72 | Ht 70.0 in | Wt 225.0 lb

## 2022-06-28 DIAGNOSIS — E782 Mixed hyperlipidemia: Secondary | ICD-10-CM | POA: Diagnosis not present

## 2022-06-28 DIAGNOSIS — Z87891 Personal history of nicotine dependence: Secondary | ICD-10-CM | POA: Diagnosis not present

## 2022-06-28 DIAGNOSIS — R079 Chest pain, unspecified: Secondary | ICD-10-CM

## 2022-06-28 DIAGNOSIS — R0989 Other specified symptoms and signs involving the circulatory and respiratory systems: Secondary | ICD-10-CM

## 2022-06-28 DIAGNOSIS — I25118 Atherosclerotic heart disease of native coronary artery with other forms of angina pectoris: Secondary | ICD-10-CM

## 2022-06-28 NOTE — Patient Instructions (Addendum)
Medication Instructions:  Your physician recommends that you continue on your current medications as directed. Please refer to the Current Medication list given to you today.  *If you need a refill on your cardiac medications before your next appointment, please call your pharmacy*  Lab Work: If you have labs (blood work) drawn today and your tests are completely normal, you will receive your results only by: MyChart Message (if you have MyChart) OR A paper copy in the mail If you have any lab test that is abnormal or we need to change your treatment, we will call you to review the results.  Testing/Procedures: Your physician has requested that you have a lexiscan myoview. For further information please visit [email or web address] Please follow instruction sheet, as given.  Your physician has requested that you have a carotid duplex. This test is an ultrasound of the carotid arteries in your neck. It looks at blood flow through these arteries that supply the brain with blood. Allow one hour for this exam. There are no restrictions or special instructions.  Non-Cardiac CT scanning, (CAT scanning) for lung cancer scoring, is a noninvasive, special x-Andrius that produces cross-sectional images of the body using x-rays and a computer. CT scans help physicians diagnose and treat medical conditions. For some CT exams, a contrast material is used to enhance visibility in the area of the body being studied. CT scans provide greater clarity and reveal more details than regular x-Janoah exams.  Follow-Up: At [facility], you and your health needs are our priority.  As part of our continuing mission to provide you with exceptional heart care, we have created designated Provider Care Teams.  These Care Teams include your primary Cardiologist (physician) and Advanced Practice Providers (APPs -  Physician Assistants and Nurse Practitioners) who all work together to provide you with the care you need, when  you need it.  We recommend signing up for the patient portal called "MyChart".  Sign up information is provided on this After Visit Summary.  MyChart is used to connect with patients for Virtual Visits (Telemedicine).  Patients are able to view lab/test results, encounter notes, upcoming appointments, etc.  Non-urgent messages can be sent to your provider as well.   To learn more about what you can do with MyChart, go to [email or web address].    Your next appointment:   3 month(s)  The format for your next appointment:   In Person  Provider:   Charlton Haws, MD     Other Instructions You have been referred to Lipid Clinic  Important Information About Sugar

## 2022-07-05 LAB — EXTERNAL GENERIC LAB PROCEDURE: COLOGUARD: POSITIVE — AB

## 2022-07-09 ENCOUNTER — Ambulatory Visit (HOSPITAL_COMMUNITY)
Admission: RE | Admit: 2022-07-09 | Discharge: 2022-07-09 | Disposition: A | Discharge status: Home / Self Care | Payer: BC Managed Care – PPO | Source: Ambulatory Visit | Attending: Cardiovascular Disease | Admitting: Cardiovascular Disease

## 2022-07-09 ENCOUNTER — Ambulatory Visit (HOSPITAL_BASED_OUTPATIENT_CLINIC_OR_DEPARTMENT_OTHER): Payer: BC Managed Care – PPO

## 2022-07-09 DIAGNOSIS — I25118 Atherosclerotic heart disease of native coronary artery with other forms of angina pectoris: Secondary | ICD-10-CM | POA: Diagnosis not present

## 2022-07-09 DIAGNOSIS — E782 Mixed hyperlipidemia: Secondary | ICD-10-CM | POA: Diagnosis not present

## 2022-07-09 DIAGNOSIS — R079 Chest pain, unspecified: Secondary | ICD-10-CM | POA: Insufficient documentation

## 2022-07-09 DIAGNOSIS — R0989 Other specified symptoms and signs involving the circulatory and respiratory systems: Secondary | ICD-10-CM | POA: Diagnosis not present

## 2022-07-09 DIAGNOSIS — Z87891 Personal history of nicotine dependence: Secondary | ICD-10-CM | POA: Insufficient documentation

## 2022-07-09 LAB — MYOCARDIAL PERFUSION IMAGING
LV dias vol: 272 mL (ref 62–150)
LV sys vol: 192 mL
Nuc Stress EF: 29 %
Peak HR: 88 {beats}/min
Rest HR: 68 {beats}/min
Rest Nuclear Isotope Dose: 10.1 mCi
SDS: 9
SRS: 7
SSS: 16
ST Depression (mm): 0 mm
Stress Nuclear Isotope Dose: 31.9 mCi
TID: 1.04

## 2022-07-09 MED ORDER — TECHNETIUM TC 99M TETROFOSMIN IV KIT
10.1000 | PACK | Freq: Once | INTRAVENOUS | Status: AC | PRN
  Administered 2022-07-09: 10.1 via INTRAVENOUS

## 2022-07-09 MED ORDER — REGADENOSON 0.4 MG/5ML IV SOLN
0.4000 mg | Freq: Once | INTRAVENOUS | Status: AC
  Administered 2022-07-09: 0.4 mg via INTRAVENOUS

## 2022-07-09 MED ORDER — TECHNETIUM TC 99M TETROFOSMIN IV KIT
31.9000 | PACK | Freq: Once | INTRAVENOUS | Status: AC | PRN
  Administered 2022-07-09: 31.9 via INTRAVENOUS

## 2022-07-12 ENCOUNTER — Telehealth: Payer: Self-pay | Admitting: Cardiovascular Disease

## 2022-07-12 DIAGNOSIS — R079 Chest pain, unspecified: Secondary | ICD-10-CM

## 2022-07-12 DIAGNOSIS — R9430 Abnormal result of cardiovascular function study, unspecified: Secondary | ICD-10-CM

## 2022-07-12 NOTE — Telephone Encounter (Signed)
Wendall Stade, MD  Ethelda Chick, RN Scan shows IMI with reduced EF need echo and f/u with me to discuss cath after echo  The patient has been notified of the result and verbalized understanding.  All questions (if any) were answered. Ethelda Chick, RN 07/12/2022 4:07 PM

## 2022-07-12 NOTE — Telephone Encounter (Signed)
Patient was returning phone call. Please call back 

## 2022-07-13 NOTE — Progress Notes (Signed)
CARDIOLOGY CONSULT NOTE       Patient ID: Grant Edward Gatlin Jr. MRN: [id number] DOB/AGE: 10/22/1955 65 y.o.  Admit date: (Not on file) Referring Physician: Escajeda Primary Physician: Pcp, No Primary Cardiologist: Franceska Strahm    HPI:  65 y.o. 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   Myovue done 07/09/22 showed old IMI estimated EF 29% Carotid with bilateral 40-59% ICA stenosis same day TTE 07/16/22 EF 30-35% trivial MR   Multiple issues discussed today Need for heart cat to see if RCA stent open and why EF so low. Also discussed need to starte GDMT for CHF with entresto. Finally discussed possible need for CRT/AICD If EF does not improved with any kind of revascularization or medication   Shared Decision Making/Informed Consent The risks [stroke (1 in 1000), death (1 in 1000), kidney failure [usually temporary] (1 in 500), bleeding (1 in 200), allergic reaction [possibly serious] (1 in 200)],  benefits (diagnostic support and management of coronary artery disease) and alternatives of a cardiac catheterization were discussed in detail with Ms. Brim and she is willing to proceed.   ROS All other systems reviewed and negative except as noted above  Past Medical History:  Diagnosis Date   Arthritis    BPH (benign prostatic hyperplasia)    CAD (coronary artery disease)    Coronary artery disease    GAD (generalized anxiety disorder)    Hyperglycemia    Hypertension    Lightheadedness    Myocardial infarction (HCC) 2010   stent   Syncope    Tremor     Family History  Problem Relation Age of Onset   Hypertension Mother    Hypertension Father    Aortic aneurysm Father     Social History   Socioeconomic History   Marital status: Married    Spouse name: Not on file   Number of children: Not on file   Years of education: Not on file   Highest education level: Not on file  Occupational History   Not on file  Tobacco Use   Smoking status: Every Day    Packs/day: 1.00    Types: Cigarettes   Smokeless tobacco: Never  Vaping Use   Vaping Use: Some days  Substance and Sexual Activity   Alcohol use: Not Currently   Drug use: Never   Sexual activity: Not on file  Other Topics   Concern   Not on file  Social History Narrative   Not on file   Social Determinants of Health   Financial Resource Strain: Not on file  Food Insecurity: Not on file  Transportation Needs: Not on file  Physical Activity: Not on file  Stress: Not on file  Social Connections: Not on file  Intimate Partner Violence: Not on file    Past Surgical History:  Procedure Laterality Date   ARTERY AND TENDON REPAIR Left 07/15/2020   Procedure: LEFT INDEX , LONG,AND RING FINGER EXPLORATION ,REPAIR TENDON,ARTERY AND NERVE;  Surgeon: Kuzma, Kevin, MD;  Location: [facility] OR;  Service: Orthopedics;  Laterality: Left;   CORONARY ANGIOPLASTY  2010   LIPOMA EXCISION  2012   forehead      Current Outpatient  Medications:    aspirin EC 81 MG tablet, Take 81 mg by mouth at bedtime. Swallow whole., Disp: , Rfl:    ezetimibe (ZETIA) 10 MG tablet, Take 10 mg by mouth daily., Disp: , Rfl:    FARXIGA 10 MG TABS tablet, Take 10 mg by mouth daily., Disp: , Rfl:    loratadine (CLARITIN) 10 MG tablet, Take 10 mg by mouth daily as needed for allergies., Disp: , Rfl:    metoprolol tartrate (LOPRESSOR) 25 MG tablet, Take 25 mg by mouth daily., Disp: , Rfl:    naproxen sodium (ALEVE) 220 MG tablet, Take 440 mg by mouth daily as needed (pain)., Disp: , Rfl:    omega-3 acid ethyl esters (LOVAZA) 1 g capsule, Take 1 g by mouth daily., Disp: , Rfl:    tadalafil (CIALIS) 5 MG tablet, , Disp: , Rfl:    gabapentin (NEURONTIN) 100 MG capsule, Take 2 capsules (200 mg total) by mouth at bedtime., Disp: 180 capsule, Rfl: 3    Physical Exam: Blood pressure (!) 144/84, pulse 76, height 5' 10" (1.778 m), weight 222 lb 6.4 oz (100.9 kg), SpO2 98 %.    Affect appropriate Chronically ill male HEENT: normal Neck supple with no adenopathy JVP normal bilateral bruits no thyromegaly Lungs clear with no wheezing and good diaphragmatic motion Heart:  S1/S2 SEM murmur, no rub, gallop or click PMI normal Abdomen: benighn, BS positve, no tenderness, no AAA no bruit.  No HSM or HJR Distal pulses palpable  No edema Neuro non-focal Skin warm and dry No muscular weakness  Labs:   Lab Results  Component Value Date   WBC 7.3 07/15/2020   HGB 16.3 07/15/2020   HCT 49.4 07/15/2020   MCV 93.4 07/15/2020   PLT 290 07/15/2020   No results for input(s): "NA", "K", "CL", "CO2", "BUN", "CREATININE", "CALCIUM", "PROT", "BILITOT", "ALKPHOS", "ALT", "AST", "GLUCOSE" in the last 168 hours.  Invalid input(s): "LABALBU" No results found for: "CKTOTAL", "CKMB", "CKMBINDEX", "TROPONINI" No results found for: "CHOL" No results found for: "HDL" No results found for: "LDLCALC" No results found for: "TRIG" No results found for:  "CHOLHDL" No results found for: "LDLDIRECT"    Radiology: ECHOCARDIOGRAM COMPLETE  Result Date: 07/16/2022    ECHOCARDIOGRAM REPORT   Patient Name:   Grant Edward Bonzo Jr. Date of Exam: 07/16/2022 Medical Rec #:  [id number]            Height:       70.0 in Accession #:    [id number]           Weight:       225.0 lb Date of Birth:  01/01/1956           BSA:            2.194 m Patient Age:    65 years             BP:           140/80 mmHg Patient Gender: M                    HR:           64 bpm. Exam Location:  Outpatient Procedure: 2D Echo, 3D Echo, Intracardiac Opacification Agent, Color Doppler and            Cardiac Doppler Indications:    Low left ventricular ejection fraction  History:        Patient has no prior history of Echocardiogram examinations. CAD                 and Previous Myocardial Infarction,                 Signs/Symptoms:Dizziness/Lightheadedness; Risk Factors:Current                 Smoker and Dyslipidemia.  Sonographer:    Johanna Elliott RDCS Referring Phys: [id number] Anel Purohit C Quanna Wittke  Sonographer Comments: Post definity 145/85 IMPRESSIONS  1. Global hypokinesis worse in the inferoseptal, inferior and inferolateral myocardium. Left ventricular ejection fraction, by estimation, is 30 to 35%. The left ventricle has moderately decreased function. The left ventricle demonstrates global hypokinesis. Left ventricular diastolic parameters are consistent with Grade I diastolic dysfunction (impaired relaxation). Elevated left ventricular end-diastolic pressure.  2. Right ventricular systolic function is normal. The right ventricular size is normal.  3. The mitral valve is normal in structure. Trivial mitral valve regurgitation. No evidence of mitral stenosis. Moderate mitral annular calcification.  4. The aortic valve is tricuspid. There is mild calcification of the aortic valve. There is mild thickening of the aortic valve. Aortic valve regurgitation is not visualized. No aortic stenosis is present.  5.  The inferior vena cava is normal in size with greater than 50% respiratory variability, suggesting right atrial pressure of 3 mmHg. FINDINGS  Left Ventricle: Global hypokinesis worse in the inferoseptal, inferior and inferolateral myocardium. Left ventricular ejection fraction, by estimation, is 30 to 35%. The left ventricle has moderately decreased function. The left ventricle demonstrates global hypokinesis. Definity contrast agent was given IV to delineate the left ventricular endocardial borders. The left ventricular internal cavity size was normal in size. There is no left ventricular hypertrophy. Left ventricular diastolic parameters are consistent with Grade I diastolic dysfunction (impaired relaxation). Elevated left ventricular end-diastolic pressure. Right Ventricle: The right ventricular size is normal. No increase in right ventricular wall thickness. Right ventricular systolic function is normal. Left Atrium: Left atrial size was normal in size. Right Atrium: Right atrial size was normal in size. Pericardium: There is no evidence of pericardial effusion. Mitral Valve: The mitral valve is normal in structure. Moderate mitral annular calcification. Trivial mitral valve regurgitation. No evidence of mitral valve stenosis. Tricuspid Valve: The tricuspid valve is normal in structure. Tricuspid valve regurgitation is not demonstrated. No evidence of tricuspid stenosis. Aortic Valve: The aortic valve is tricuspid. There is mild calcification of the aortic valve. There is mild thickening of the aortic valve. Aortic valve regurgitation is not visualized. No aortic stenosis is present. Pulmonic Valve: The pulmonic valve was normal in structure. Pulmonic valve regurgitation is not visualized. No evidence of pulmonic stenosis. Aorta: The aortic root is normal in size and structure. Venous: The inferior vena cava is normal in size with greater than   50% respiratory variability, suggesting right atrial pressure of 3  mmHg. IAS/Shunts: No atrial level shunt detected by color flow Doppler.  LEFT VENTRICLE PLAX 2D LVOT diam:     2.00 cm   Diastology LVOT Area:     3.14 cm  LV e' medial:    3.37 cm/s                          LV E/e' medial:  17.7                          LV e' lateral:   7.07 cm/s                          LV E/e' lateral: 8.4                           3D Volume EF:                          3D EF:        39 %                          LV EDV:       222 ml                          LV ESV:       136 ml                          LV SV:        86 ml RIGHT VENTRICLE RV Basal diam:  2.91 cm RV Mid diam:    2.51 cm RV S prime:     9.46 cm/s TAPSE (M-mode): 2.0 cm LEFT ATRIUM             Index        RIGHT ATRIUM           Index LA diam:        4.30 cm 1.96 cm/m   RA Area:     11.90 cm LA Vol (A2C):   61.7 ml 28.12 ml/m  RA Volume:   27.90 ml  12.71 ml/m LA Vol (A4C):   51.5 ml 23.47 ml/m LA Biplane Vol: 60.8 ml 27.71 ml/m   AORTA Ao Root diam: 3.30 cm Ao Asc diam:  3.40 cm MITRAL VALVE MV Area (PHT): 3.48 cm     SHUNTS MV Decel Time: 218 msec     Systemic Diam: 2.00 cm MV E velocity: 59.70 cm/s MV A velocity: 108.00 cm/s MV E/A ratio:  0.55 Tiffany Grimes MD Electronically signed by Tiffany Humboldt MD Signature Date/Time: 07/16/2022/12:35:22 PM    Final    VAS US CAROTID  Result Date: 07/10/2022 Carotid Arterial Duplex Study Patient Name:  Grant Edward Rud Jr.  Date of Exam:   07/09/2022 Medical Rec #: [id number]             Accession #:    [id number] Date of Birth: 07/04/1956            Patient Gender: M Patient Age:   65 years Exam Location:  [facility] Procedure:      VAS US CAROTID Referring Phys: Kaloni Bisaillon --------------------------------------------------------------------------------    Indications:      Bilateral bruits and patient denies any cerebrocascular                   symptoms. Risk Factors:     Current smoker. Comparison Study: NA Performing Technologist: Johanna Elliott RDCS  Examination  Guidelines: A complete evaluation includes B-mode imaging, spectral Doppler, color Doppler, and power Doppler as needed of all accessible portions of each vessel. Bilateral testing is considered an integral part of a complete examination. Limited examinations for reoccurring indications may be performed as noted.  Right Carotid Findings: +----------+--------+--------+--------+------------------+---------+           PSV cm/sEDV cm/sStenosisPlaque DescriptionComments  +----------+--------+--------+--------+------------------+---------+ CCA Prox  115     21                                          +----------+--------+--------+--------+------------------+---------+ CCA Mid   90      16                                          +----------+--------+--------+--------+------------------+---------+ CCA Distal93      25              heterogenous                +----------+--------+--------+--------+------------------+---------+ ICA Prox  206     53      40-59%  calcific          Shadowing +----------+--------+--------+--------+------------------+---------+ ICA Mid   104     24                                          +----------+--------+--------+--------+------------------+---------+ ICA Distal66      15                                          +----------+--------+--------+--------+------------------+---------+ ECA       249     29      >50%    calcific                    +----------+--------+--------+--------+------------------+---------+ +----------+--------+-------+----------------+-------------------+           PSV cm/sEDV cmsDescribe        Arm Pressure (mmHG) +----------+--------+-------+----------------+-------------------+ Subclavian135            Multiphasic, WNL144                 +----------+--------+-------+----------------+-------------------+ +---------+--------+--+--------+--+---------+ VertebralPSV cm/s74EDV cm/s25Antegrade  +---------+--------+--+--------+--+---------+  Left Carotid Findings: +----------+--------+--------+--------+------------------+---------+           PSV cm/sEDV cm/sStenosisPlaque DescriptionComments  +----------+--------+--------+--------+------------------+---------+ CCA Prox  71      21                                          +----------+--------+--------+--------+------------------+---------+ CCA Mid   85      22              heterogenous                +----------+--------+--------+--------+------------------+---------+   CCA Distal82      22              heterogenous                +----------+--------+--------+--------+------------------+---------+ ICA Prox  178     64      40-59%  calcific          Shadowing +----------+--------+--------+--------+------------------+---------+ ICA Mid   153     42                                          +----------+--------+--------+--------+------------------+---------+ ICA Distal99      30                                          +----------+--------+--------+--------+------------------+---------+ ECA       228     35      >50%    calcific                    +----------+--------+--------+--------+------------------+---------+ +----------+--------+--------+----------------+-------------------+           PSV cm/sEDV cm/sDescribe        Arm Pressure (mmHG) +----------+--------+--------+----------------+-------------------+ Subclavian167     10      Multiphasic, WNL142                 +----------+--------+--------+----------------+-------------------+ +---------+--------+--+--------+--+---------+ VertebralPSV cm/s59EDV cm/s30Antegrade +---------+--------+--+--------+--+---------+   Summary: Right Carotid: Velocities in the right ICA are consistent with a 40-59%                stenosis. The ECA appears >50% stenosed. Left Carotid: Velocities in the left ICA are consistent with a 40-59% stenosis.                Non-hemodynamically significant plaque <50% noted in the CCA. The               ECA appears >50% stenosed. Vertebrals:  Bilateral vertebral arteries demonstrate antegrade flow. Subclavians: Normal flow hemodynamics were seen in bilateral subclavian              arteries. *See table(s) above for measurements and observations. Suggest follow up study in 12 months. Electronically signed by Amazing Cowman MD on 07/10/2022 at 8:57:52 PM.    Final    MYOCARDIAL PERFUSION IMAGING  Result Date: 07/09/2022   Findings are consistent with prior myocardial infarction in the RCA territory and no ischemia. The study is high risk due to reduced systolic function.   No ST deviation was noted.   Left ventricular function is abnormal. There were multiple regional abnormalities. Nuclear stress EF: 29 %. The left ventricular ejection fraction is severely decreased (<30%). End systolic cavity size is moderately enlarged.   Prior study not available for comparison.    EKG: SR rate 72 LAD RBBB PR 212 msec ? Old IMI    ASSESSMENT AND PLAN:   CAD:  distant stent to RCA in 2010 Myovue 07/09/22 with inferior scar no ischemia but EF 29% and 30-35% by TTE concern for severe ischemic DCM Need to risk stratify for AICD CRT with QRS 174 msec with RBBB / LAD Left heart cath Patient prefers first week in January given insurance / deductible issues  HLD:  refer to lipid clinic needs to be on low dose statin and   PSK9 Smoking: counseled on cessation < 10 minutes Lung cancer CT Dizziness: does not sound like arrhythmia ? Vertigo carotids not high grade and antegrade vertebral flow  HTN:  continue current meds Bruit:  bilateral 40-59% ICA stenosis f/u duplex 07/2023 Ischemic DCM:  euvolemic on Farxiga and lopresser Start entresto Will pre cath labs in 3 weeks anyway    Carotids  07/2023  Refer lipid clinic for PSK9 Lung cancer CT  Pre cat labs  Cath with 08/11/22 with Dr Smith orders written lab called  F/U in 3 months    Signed: Azya Barbero 07/20/2022, 11:29 AM   

## 2022-07-13 NOTE — H&P (View-Only) (Signed)
CARDIOLOGY CONSULT NOTE       Patient ID: Grant Ochoa. MRN: [id number] DOB/AGE: August 20, 1955 66 y.o.  Admit date: (Not on file) Referring Physician: Watt Climes Primary Physician: Pcp, No Primary Cardiologist: Eden Emms    HPI:  66 y.o. 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   Myovue done 07/09/22 showed old IMI estimated EF 29% Carotid with bilateral 40-59% ICA stenosis same day TTE 07/16/22 EF 30-35% trivial MR   Multiple issues discussed today Need for heart cat to see if RCA stent open and why EF so low. Also discussed need to starte GDMT for CHF with entresto. Finally discussed possible need for CRT/AICD If EF does not improved with any kind of revascularization or medication   Shared Decision Making/Informed Consent The risks [stroke (1 in 1000), death (1 in 1000), kidney failure [usually temporary] (1 in 500), bleeding (1 in 200), allergic reaction [possibly serious] (1 in 200)],  benefits (diagnostic support and management of coronary artery disease) and alternatives of a cardiac catheterization were discussed in detail with Ms. Wanita Chamberlain and she is willing to proceed.   ROS All other systems reviewed and negative except as noted above  Past Medical History:  Diagnosis Date   Arthritis    BPH (benign prostatic hyperplasia)    CAD (coronary artery disease)    Coronary artery disease    GAD (generalized anxiety disorder)    Hyperglycemia    Hypertension    Lightheadedness    Myocardial infarction [facility]) 2010   stent   Syncope    Tremor     Family History  Problem Relation Age of Onset   Hypertension Mother    Hypertension Father    Aortic aneurysm Father     Social History   Socioeconomic History   Marital status: Married    Spouse name: Not on file   Number of children: Not on file   Years of education: Not on file   Highest education level: Not on file  Occupational History   Not on file  Tobacco Use   Smoking status: Every Day    Packs/day: 1.00    Types: Cigarettes   Smokeless tobacco: Never  Vaping Use   Vaping Use: Some days  Substance and Sexual Activity   Alcohol use: Not Currently   Drug use: Never   Sexual activity: Not on file  Other Topics  Concern   Not on file  Social History Narrative   Not on file   Social Determinants of Health   Financial Resource Strain: Not on file  Food Insecurity: Not on file  Transportation Needs: Not on file  Physical Activity: Not on file  Stress: Not on file  Social Connections: Not on file  Intimate Partner Violence: Not on file    Past Surgical History:  Procedure Laterality Date   ARTERY AND TENDON REPAIR Left 07/15/2020   Procedure: LEFT INDEX , LONG,AND RING FINGER EXPLORATION ,REPAIR TENDON,ARTERY AND NERVE;  Surgeon: Betha Loa, MD;  Location: [facility] OR;  Service: Orthopedics;  Laterality: Left;   CORONARY ANGIOPLASTY  2010   LIPOMA EXCISION  2012   forehead      Current Outpatient  Medications:    aspirin EC 81 MG tablet, Take 81 mg by mouth at bedtime. Swallow whole., Disp: , Rfl:    ezetimibe (ZETIA) 10 MG tablet, Take 10 mg by mouth daily., Disp: , Rfl:    FARXIGA 10 MG TABS tablet, Take 10 mg by mouth daily., Disp: , Rfl:    loratadine (CLARITIN) 10 MG tablet, Take 10 mg by mouth daily as needed for allergies., Disp: , Rfl:    metoprolol tartrate (LOPRESSOR) 25 MG tablet, Take 25 mg by mouth daily., Disp: , Rfl:    naproxen sodium (ALEVE) 220 MG tablet, Take 440 mg by mouth daily as needed (pain)., Disp: , Rfl:    omega-3 acid ethyl esters (LOVAZA) 1 g capsule, Take 1 g by mouth daily., Disp: , Rfl:    tadalafil (CIALIS) 5 MG tablet, , Disp: , Rfl:    gabapentin (NEURONTIN) 100 MG capsule, Take 2 capsules (200 mg total) by mouth at bedtime., Disp: 180 capsule, Rfl: 3    Physical Exam: Blood pressure (!) 144/84, pulse 76, height 5\' 10"  (1.778 m), weight 222 lb 6.4 oz (100.9 kg), SpO2 98 %.    Affect appropriate Chronically ill male HEENT: normal Neck supple with no adenopathy JVP normal bilateral bruits no thyromegaly Lungs clear with no wheezing and good diaphragmatic motion Heart:  S1/S2 SEM murmur, no rub, gallop or click PMI normal Abdomen: benighn, BS positve, no tenderness, no AAA no bruit.  No HSM or HJR Distal pulses palpable  No edema Neuro non-focal Skin warm and dry No muscular weakness  Labs:   Lab Results  Component Value Date   WBC 7.3 07/15/2020   HGB 16.3 07/15/2020   HCT 49.4 07/15/2020   MCV 93.4 07/15/2020   PLT 290 07/15/2020   No results for input(s): "NA", "K", "CL", "CO2", "BUN", "CREATININE", "CALCIUM", "PROT", "BILITOT", "ALKPHOS", "ALT", "AST", "GLUCOSE" in the last 168 hours.  Invalid input(s): "LABALBU" No results found for: "CKTOTAL", "CKMB", "CKMBINDEX", "TROPONINI" No results found for: "CHOL" No results found for: "HDL" No results found for: "LDLCALC" No results found for: "TRIG" No results found for:  "CHOLHDL" No results found for: "LDLDIRECT"    Radiology: ECHOCARDIOGRAM COMPLETE  Result Date: 07/16/2022    ECHOCARDIOGRAM REPORT   Patient Name:   Grant Ochoa. Date of Exam: 07/16/2022 Medical Rec #:  [id number]/2022            Height:       70.0 in Accession #:    [id number]           Weight:       225.0 lb Date of Birth:  22-Apr-1956           BSA:  2.194 m Patient Age:    65 years             BP:           140/80 mmHg Patient Gender: M                    HR:           64 bpm. Exam Location:  Outpatient Procedure: 2D Echo, 3D Echo, Intracardiac Opacification Agent, Color Doppler and            Cardiac Doppler Indications:    Low left ventricular ejection fraction  History:        Patient has no prior history of Echocardiogram examinations. CAD                 and Previous Myocardial Infarction,                 Signs/Symptoms:Dizziness/Lightheadedness; Risk Factors:Current                 Smoker and Dyslipidemia.  Sonographer:    Jeryl Columbia RDCS Referring Phys: [phone] Wendall Stade  Sonographer Comments: Post definity 145/85 IMPRESSIONS  1. Global hypokinesis worse in the inferoseptal, inferior and inferolateral myocardium. Left ventricular ejection fraction, by estimation, is 30 to 35%. The left ventricle has moderately decreased function. The left ventricle demonstrates global hypokinesis. Left ventricular diastolic parameters are consistent with Grade I diastolic dysfunction (impaired relaxation). Elevated left ventricular end-diastolic pressure.  2. Right ventricular systolic function is normal. The right ventricular size is normal.  3. The mitral valve is normal in structure. Trivial mitral valve regurgitation. No evidence of mitral stenosis. Moderate mitral annular calcification.  4. The aortic valve is tricuspid. There is mild calcification of the aortic valve. There is mild thickening of the aortic valve. Aortic valve regurgitation is not visualized. No aortic stenosis is present.  5.  The inferior vena cava is normal in size with greater than 50% respiratory variability, suggesting right atrial pressure of 3 mmHg. FINDINGS  Left Ventricle: Global hypokinesis worse in the inferoseptal, inferior and inferolateral myocardium. Left ventricular ejection fraction, by estimation, is 30 to 35%. The left ventricle has moderately decreased function. The left ventricle demonstrates global hypokinesis. Definity contrast agent was given IV to delineate the left ventricular endocardial borders. The left ventricular internal cavity size was normal in size. There is no left ventricular hypertrophy. Left ventricular diastolic parameters are consistent with Grade I diastolic dysfunction (impaired relaxation). Elevated left ventricular end-diastolic pressure. Right Ventricle: The right ventricular size is normal. No increase in right ventricular wall thickness. Right ventricular systolic function is normal. Left Atrium: Left atrial size was normal in size. Right Atrium: Right atrial size was normal in size. Pericardium: There is no evidence of pericardial effusion. Mitral Valve: The mitral valve is normal in structure. Moderate mitral annular calcification. Trivial mitral valve regurgitation. No evidence of mitral valve stenosis. Tricuspid Valve: The tricuspid valve is normal in structure. Tricuspid valve regurgitation is not demonstrated. No evidence of tricuspid stenosis. Aortic Valve: The aortic valve is tricuspid. There is mild calcification of the aortic valve. There is mild thickening of the aortic valve. Aortic valve regurgitation is not visualized. No aortic stenosis is present. Pulmonic Valve: The pulmonic valve was normal in structure. Pulmonic valve regurgitation is not visualized. No evidence of pulmonic stenosis. Aorta: The aortic root is normal in size and structure. Venous: The inferior vena cava is normal in size with greater than  50% respiratory variability, suggesting right atrial pressure of 3  mmHg. IAS/Shunts: No atrial level shunt detected by color flow Doppler.  LEFT VENTRICLE PLAX 2D LVOT diam:     2.00 cm   Diastology LVOT Area:     3.14 cm  LV e' medial:    3.37 cm/s                          LV E/e' medial:  17.7                          LV e' lateral:   7.07 cm/s                          LV E/e' lateral: 8.4                           3D Volume EF:                          3D EF:        39 %                          LV EDV:       222 ml                          LV ESV:       136 ml                          LV SV:        86 ml RIGHT VENTRICLE RV Basal diam:  2.91 cm RV Mid diam:    2.51 cm RV S prime:     9.46 cm/s TAPSE (M-mode): 2.0 cm LEFT ATRIUM             Index        RIGHT ATRIUM           Index LA diam:        4.30 cm 1.96 cm/m   RA Area:     11.90 cm LA Vol (A2C):   61.7 ml 28.12 ml/m  RA Volume:   27.90 ml  12.71 ml/m LA Vol (A4C):   51.5 ml 23.47 ml/m LA Biplane Vol: 60.8 ml 27.71 ml/m   AORTA Ao Root diam: 3.30 cm Ao Asc diam:  3.40 cm MITRAL VALVE MV Area (PHT): 3.48 cm     SHUNTS MV Decel Time: 218 msec     Systemic Diam: 2.00 cm MV E velocity: 59.70 cm/s MV A velocity: 108.00 cm/s MV E/A ratio:  0.55 Chilton Si MD Electronically signed by Chilton Si MD Signature Date/Time: 07/16/2022/12:35:22 PM    Final    VAS US CAROTID  Result Date: 07/10/2022 Carotid Arterial Duplex Study Patient Name:  Leobardo Granlund.  Date of Exam:   07/09/2022 Medical Rec #: [id number]             Accession #:    [id number] Date of Birth: 1956-04-04            Patient Gender: M Patient Age:   44 years Exam Location:  [facility] Procedure:      VAS US CAROTID Referring Phys: Charlton Haws --------------------------------------------------------------------------------  Indications:      Bilateral bruits and patient denies any cerebrocascular                   symptoms. Risk Factors:     Current smoker. Comparison Study: NA Performing Technologist: Jeryl Columbia RDCS  Examination  Guidelines: A complete evaluation includes B-mode imaging, spectral Doppler, color Doppler, and power Doppler as needed of all accessible portions of each vessel. Bilateral testing is considered an integral part of a complete examination. Limited examinations for reoccurring indications may be performed as noted.  Right Carotid Findings: +----------+--------+--------+--------+------------------+---------+           PSV cm/sEDV cm/sStenosisPlaque DescriptionComments  +----------+--------+--------+--------+------------------+---------+ CCA Prox  115     21                                          +----------+--------+--------+--------+------------------+---------+ CCA Mid   90      16                                          +----------+--------+--------+--------+------------------+---------+ CCA Distal93      25              heterogenous                +----------+--------+--------+--------+------------------+---------+ ICA Prox  206     53      40-59%  calcific          Shadowing +----------+--------+--------+--------+------------------+---------+ ICA Mid   104     24                                          +----------+--------+--------+--------+------------------+---------+ ICA Distal66      15                                          +----------+--------+--------+--------+------------------+---------+ ECA       249     29      >50%    calcific                    +----------+--------+--------+--------+------------------+---------+ +----------+--------+-------+----------------+-------------------+           PSV cm/sEDV cmsDescribe        Arm Pressure (mmHG) +----------+--------+-------+----------------+-------------------+ [id number]            Multiphasic, [id number]                 +----------+--------+-------+----------------+-------------------+ +---------+--------+--+--------+--+---------+ VertebralPSV cm/s74EDV cm/s25Antegrade  +---------+--------+--+--------+--+---------+  Left Carotid Findings: +----------+--------+--------+--------+------------------+---------+           PSV cm/sEDV cm/sStenosisPlaque DescriptionComments  +----------+--------+--------+--------+------------------+---------+ CCA Prox  71      21                                          +----------+--------+--------+--------+------------------+---------+ CCA Mid   85      22              heterogenous                +----------+--------+--------+--------+------------------+---------+  CCA Distal82      22              heterogenous                +----------+--------+--------+--------+------------------+---------+ ICA Prox  178     64      40-59%  calcific          Shadowing +----------+--------+--------+--------+------------------+---------+ ICA Mid   153     42                                          +----------+--------+--------+--------+------------------+---------+ ICA Distal99      30                                          +----------+--------+--------+--------+------------------+---------+ ECA       228     35      >50%    calcific                    +----------+--------+--------+--------+------------------+---------+ +----------+--------+--------+----------------+-------------------+           PSV cm/sEDV cm/sDescribe        Arm Pressure (mmHG) +----------+--------+--------+----------------+-------------------+ [id number]     10      Multiphasic, [id number]                 +----------+--------+--------+----------------+-------------------+ +---------+--------+--+--------+--+---------+ VertebralPSV cm/s59EDV cm/s30Antegrade +---------+--------+--+--------+--+---------+   Summary: Right Carotid: Velocities in the right ICA are consistent with a 40-59%                stenosis. The ECA appears >50% stenosed. Left Carotid: Velocities in the left ICA are consistent with a 40-59% stenosis.                Non-hemodynamically significant plaque <50% noted in the CCA. The               ECA appears >50% stenosed. Vertebrals:  Bilateral vertebral arteries demonstrate antegrade flow. Subclavians: Normal flow hemodynamics were seen in bilateral subclavian              arteries. *See table(s) above for measurements and observations. Suggest follow up study in 12 months. Electronically signed by Charlton Haws MD on 07/10/2022 at 8:57:52 PM.    Final    MYOCARDIAL PERFUSION IMAGING  Result Date: 07/09/2022   Findings are consistent with prior myocardial infarction in the RCA territory and no ischemia. The study is high risk due to reduced systolic function.   No ST deviation was noted.   Left ventricular function is abnormal. There were multiple regional abnormalities. Nuclear stress EF: 29 %. The left ventricular ejection fraction is severely decreased (<30%). End systolic cavity size is moderately enlarged.   Prior study not available for comparison.    EKG: SR rate 72 LAD RBBB PR 212 msec ? Old IMI    ASSESSMENT AND PLAN:   CAD:  distant stent to RCA in 2010 Myovue 07/09/22 with inferior scar no ischemia but EF 29% and 30-35% by TTE concern for severe ischemic DCM Need to risk stratify for AICD CRT with QRS 174 msec with RBBB / LAD Left heart cath Patient prefers first week in January given insurance / deductible issues  HLD:  refer to lipid clinic needs to be on low dose statin and  PSK9 Smoking: counseled on cessation < 10 minutes Lung cancer CT Dizziness: does not sound like arrhythmia ? Vertigo carotids not high grade and antegrade vertebral flow  HTN:  continue current meds Bruit:  bilateral 40-59% ICA stenosis f/u duplex 07/2023 Ischemic DCM:  euvolemic on Farxiga and lopresser Start entresto Will pre cath labs in 3 weeks anyway    Carotids  07/2023  Refer lipid clinic for PSK9 Lung cancer CT  Pre cat labs  Cath with 08/11/22 with Dr Katrinka BlazingSmith orders written lab called  F/U in 3 months    Signed: Charlton Hawseter Klark Vanderhoef 07/20/2022, 11:29 AM

## 2022-07-16 ENCOUNTER — Ambulatory Visit (INDEPENDENT_AMBULATORY_CARE_PROVIDER_SITE_OTHER): Payer: BC Managed Care – PPO

## 2022-07-16 DIAGNOSIS — R9430 Abnormal result of cardiovascular function study, unspecified: Secondary | ICD-10-CM

## 2022-07-16 DIAGNOSIS — R079 Chest pain, unspecified: Secondary | ICD-10-CM | POA: Diagnosis not present

## 2022-07-16 LAB — ECHOCARDIOGRAM COMPLETE: Area-P 1/2: 3.48 cm2

## 2022-07-16 MED ORDER — PERFLUTREN LIPID MICROSPHERE
1.0000 mL | INTRAVENOUS | Status: AC | PRN
  Administered 2022-07-16: 2 mL via INTRAVENOUS

## 2022-07-20 ENCOUNTER — Ambulatory Visit: Payer: BC Managed Care – PPO | Attending: Cardiovascular Disease | Admitting: Cardiovascular Disease

## 2022-07-20 ENCOUNTER — Encounter: Payer: Self-pay | Admitting: Cardiovascular Disease

## 2022-07-20 ENCOUNTER — Other Ambulatory Visit: Payer: Self-pay | Admitting: Cardiovascular Disease

## 2022-07-20 VITALS — BP 144/84 | HR 76 | Ht 70.0 in | Wt 222.4 lb

## 2022-07-20 DIAGNOSIS — I25118 Atherosclerotic heart disease of native coronary artery with other forms of angina pectoris: Secondary | ICD-10-CM

## 2022-07-20 DIAGNOSIS — R0989 Other specified symptoms and signs involving the circulatory and respiratory systems: Secondary | ICD-10-CM | POA: Diagnosis not present

## 2022-07-20 DIAGNOSIS — I255 Ischemic cardiomyopathy: Secondary | ICD-10-CM

## 2022-07-20 DIAGNOSIS — Z87891 Personal history of nicotine dependence: Secondary | ICD-10-CM

## 2022-07-20 DIAGNOSIS — E782 Mixed hyperlipidemia: Secondary | ICD-10-CM

## 2022-07-20 MED ORDER — SODIUM CHLORIDE 0.9% FLUSH: 3.0000 mL | Freq: Two times a day (BID) | INTRAVENOUS | Status: DC

## 2022-07-20 NOTE — Patient Instructions (Addendum)
Medication Instructions:  Your physician recommends that you continue on your current medications as directed. Please refer to the Current Medication list given to you today.  *If you need a refill on your cardiac medications before your next appointment, please call your pharmacy*  Lab Work: If you have labs (blood work) drawn today and your tests are completely normal, you will receive your results only by: MyChart Message (if you have MyChart) OR A paper copy in the mail If you have any lab test that is abnormal or we need to change your treatment, we will call you to review the results.  Testing/Procedures: Your physician has requested that you have a cardiac catheterization. Cardiac catheterization is used to diagnose and/or treat various heart conditions. Doctors may recommend this procedure for a number of different reasons. The most common reason is to evaluate chest pain. Chest pain can be a symptom of coronary artery disease (CAD), and cardiac catheterization can show whether plaque is narrowing or blocking your heart's arteries. This procedure is also used to evaluate the valves, as well as measure the blood flow and oxygen levels in different parts of your heart. For further information please visit [email or web address] Please follow instruction sheet, as given.  Your physician has requested that you have a carotid duplex in 1 year. This test is an ultrasound of the carotid arteries in your neck. It looks at blood flow through these arteries that supply the brain with blood. Allow one hour for this exam. There are no restrictions or special instructions.  Follow-Up: At [facility], you and your health needs are our priority.  As part of our continuing mission to provide you with exceptional heart care, we have created designated Provider Care Teams.  These Care Teams include your primary Cardiologist (physician) and Advanced Practice Providers (APPs -  Physician Assistants and  Nurse Practitioners) who all work together to provide you with the care you need, when you need it.  We recommend signing up for the patient portal called "MyChart".  Sign up information is provided on this After Visit Summary.  MyChart is used to connect with patients for Virtual Visits (Telemedicine).  Patients are able to view lab/test results, encounter notes, upcoming appointments, etc.  Non-urgent messages can be sent to your provider as well.   To learn more about what you can do with MyChart, go to [email or web address].    Your next appointment:   3 month(s) with Dr. Eden Emms  1 month with NP or PA  You have been referred to Lipid Clinic    [facility] [address] [place], [place] [address] [id number] [facility] [place] [address] Dept: [phone] Loc: [phone]  Chantry Roque Schill.  07/20/2022  You are scheduled for a Cardiac Catheterization on Wednesday, January 3 with Dr. Verdis Prime.  1. Please arrive at the [facility] (Main Entrance A) at [facility]: [address] [place], [place] [address] at 8:00 AM (This time is two hours before your procedure to ensure your preparation). Free valet parking service is available.   Special note: Every effort is made to have your procedure done on time. Please understand that emergencies sometimes delay scheduled procedures.  2. Diet: Do not eat solid foods after midnight.  The patient may have clear liquids until 5am upon the day of the procedure.  3. Labs: You will need to have  blood drawn on Friday, December 29 at [facility]: [address] [place], advertising . You do not need to be fasting.  4. Medication instructions in preparation for your procedure:   Contrast Allergy: No  Stop taking, Tadalafil (Cialis) Sunday, December 31,  Hold Farxiga Wednesday, January 3,  On the morning of your procedure, take  your Aspirin 81 mg and any morning medicines NOT listed above.  You may use sips of water.  5. Plan for one night stay--bring personal belongings. 6. Bring a current list of your medications and current insurance cards. 7. You MUST have a responsible person to drive you home. 8. Someone MUST be with you the first 24 hours after you arrive home or your discharge will be delayed. 9. Please wear clothes that are easy to get on and off and wear slip-on shoes.  Thank you for allowing [place] to care for you!   -- [facility]  Important Information About Sugar

## 2022-07-21 ENCOUNTER — Encounter: Payer: Self-pay | Admitting: Cardiovascular Disease

## 2022-07-21 MED ORDER — ENTRESTO 24-26 MG PO TABS: 1.0000 | ORAL_TABLET | Freq: Two times a day (BID) | ORAL | 11 refills | Status: DC

## 2022-07-21 NOTE — Telephone Encounter (Signed)
Per Dr. Fabio Bering office note 07/20/22 "Ischemic DCM: euvolemic on Farxiga and lopresser Start entresto Will pre cath labs in 3 weeks anyway"  I Placed order of Entresto.

## 2022-07-30 ENCOUNTER — Ambulatory Visit
Admission: RE | Admit: 2022-07-30 | Discharge: 2022-07-30 | Disposition: A | Discharge status: Home / Self Care | Payer: BC Managed Care – PPO | Source: Ambulatory Visit | Attending: Cardiovascular Disease | Admitting: Cardiovascular Disease

## 2022-07-30 DIAGNOSIS — F17210 Nicotine dependence, cigarettes, uncomplicated: Secondary | ICD-10-CM | POA: Diagnosis not present

## 2022-07-30 DIAGNOSIS — I700 Atherosclerosis of aorta: Secondary | ICD-10-CM | POA: Diagnosis not present

## 2022-07-30 DIAGNOSIS — R079 Chest pain, unspecified: Secondary | ICD-10-CM

## 2022-07-30 DIAGNOSIS — J432 Centrilobular emphysema: Secondary | ICD-10-CM | POA: Diagnosis not present

## 2022-07-30 DIAGNOSIS — E782 Mixed hyperlipidemia: Secondary | ICD-10-CM

## 2022-07-30 DIAGNOSIS — R0989 Other specified symptoms and signs involving the circulatory and respiratory systems: Secondary | ICD-10-CM

## 2022-07-30 DIAGNOSIS — Z87891 Personal history of nicotine dependence: Secondary | ICD-10-CM

## 2022-07-30 DIAGNOSIS — I2510 Atherosclerotic heart disease of native coronary artery without angina pectoris: Secondary | ICD-10-CM | POA: Diagnosis not present

## 2022-07-30 DIAGNOSIS — I25118 Atherosclerotic heart disease of native coronary artery with other forms of angina pectoris: Secondary | ICD-10-CM

## 2022-08-03 ENCOUNTER — Other Ambulatory Visit (HOSPITAL_COMMUNITY): Payer: BC Managed Care – PPO

## 2022-08-04 ENCOUNTER — Other Ambulatory Visit (HOSPITAL_COMMUNITY): Payer: BC Managed Care – PPO

## 2022-08-05 ENCOUNTER — Encounter: Payer: Self-pay | Admitting: Cardiovascular Disease

## 2022-08-06 ENCOUNTER — Other Ambulatory Visit: Payer: Self-pay

## 2022-08-06 DIAGNOSIS — R0989 Other specified symptoms and signs involving the circulatory and respiratory systems: Secondary | ICD-10-CM | POA: Diagnosis not present

## 2022-08-06 DIAGNOSIS — I25118 Atherosclerotic heart disease of native coronary artery with other forms of angina pectoris: Secondary | ICD-10-CM | POA: Diagnosis not present

## 2022-08-06 DIAGNOSIS — E782 Mixed hyperlipidemia: Secondary | ICD-10-CM | POA: Diagnosis not present

## 2022-08-06 DIAGNOSIS — Z87891 Personal history of nicotine dependence: Secondary | ICD-10-CM

## 2022-08-06 DIAGNOSIS — I255 Ischemic cardiomyopathy: Secondary | ICD-10-CM

## 2022-08-07 LAB — BASIC METABOLIC PANEL
BUN/Creatinine Ratio: 11 (ref 10–24)
BUN: 12 mg/dL (ref 8–27)
CO2: 23 mmol/L (ref 20–29)
Calcium: 9.2 mg/dL (ref 8.6–10.2)
Chloride: 100 mmol/L (ref 96–106)
Creatinine, Ser: 1.12 mg/dL (ref 0.76–1.27)
Glucose: 114 mg/dL — ABNORMAL HIGH (ref 70–99)
Potassium: 4.4 mmol/L (ref 3.5–5.2)
Sodium: 138 mmol/L (ref 134–144)
eGFR: 73 mL/min/{1.73_m2} (ref >59)

## 2022-08-07 LAB — CBC
Hematocrit: 53.9 % — ABNORMAL HIGH (ref 37.5–51.0)
Hemoglobin: 18.1 g/dL — ABNORMAL HIGH (ref 13.0–17.7)
MCH: 30.6 pg (ref 26.6–33.0)
MCHC: 33.6 g/dL (ref 31.5–35.7)
MCV: 91 fL (ref 79–97)
Platelets: 240 10*3/uL (ref 150–450)
RBC: 5.91 x10E6/uL — ABNORMAL HIGH (ref 4.14–5.80)
RDW: 13 % (ref 11.6–15.4)
WBC: 7.8 10*3/uL (ref 3.4–10.8)

## 2022-08-10 NOTE — H&P (Signed)
Severe poorly controlled risk factors with increased LDL, hypertension, tobacco use, and h/o prior MI and bilateral carotid disease. Recently identified low LVEF Left heart cath recommended to define coronary anatomy. GDMT started.

## 2022-08-11 ENCOUNTER — Ambulatory Visit (HOSPITAL_COMMUNITY)
Admission: RE | Admit: 2022-08-11 | Discharge: 2022-08-11 | Disposition: A | Discharge status: Home / Self Care | Payer: BC Managed Care – PPO | Attending: Interventional Cardiology | Admitting: Interventional Cardiology

## 2022-08-11 ENCOUNTER — Other Ambulatory Visit: Payer: Self-pay

## 2022-08-11 ENCOUNTER — Ambulatory Visit (HOSPITAL_COMMUNITY)
Admission: RE | Disposition: A | Discharge status: Home / Self Care | Payer: Self-pay | Source: Home / Self Care | Attending: Interventional Cardiology

## 2022-08-11 DIAGNOSIS — Z79899 Other long term (current) drug therapy: Secondary | ICD-10-CM | POA: Diagnosis not present

## 2022-08-11 DIAGNOSIS — I2582 Chronic total occlusion of coronary artery: Secondary | ICD-10-CM | POA: Diagnosis not present

## 2022-08-11 DIAGNOSIS — I10 Essential (primary) hypertension: Secondary | ICD-10-CM | POA: Diagnosis not present

## 2022-08-11 DIAGNOSIS — I6523 Occlusion and stenosis of bilateral carotid arteries: Secondary | ICD-10-CM | POA: Insufficient documentation

## 2022-08-11 DIAGNOSIS — R079 Chest pain, unspecified: Secondary | ICD-10-CM | POA: Diagnosis not present

## 2022-08-11 DIAGNOSIS — E785 Hyperlipidemia, unspecified: Secondary | ICD-10-CM | POA: Diagnosis not present

## 2022-08-11 DIAGNOSIS — Z955 Presence of coronary angioplasty implant and graft: Secondary | ICD-10-CM | POA: Diagnosis not present

## 2022-08-11 DIAGNOSIS — I252 Old myocardial infarction: Secondary | ICD-10-CM | POA: Diagnosis not present

## 2022-08-11 DIAGNOSIS — I255 Ischemic cardiomyopathy: Secondary | ICD-10-CM | POA: Diagnosis not present

## 2022-08-11 DIAGNOSIS — I2510 Atherosclerotic heart disease of native coronary artery without angina pectoris: Secondary | ICD-10-CM | POA: Diagnosis not present

## 2022-08-11 DIAGNOSIS — I420 Dilated cardiomyopathy: Secondary | ICD-10-CM | POA: Insufficient documentation

## 2022-08-11 DIAGNOSIS — I5022 Chronic systolic (congestive) heart failure: Secondary | ICD-10-CM

## 2022-08-11 DIAGNOSIS — F17210 Nicotine dependence, cigarettes, uncomplicated: Secondary | ICD-10-CM | POA: Insufficient documentation

## 2022-08-11 HISTORY — PX: LEFT HEART CATH AND CORONARY ANGIOGRAPHY: CATH118249

## 2022-08-11 SURGERY — LEFT HEART CATH AND CORONARY ANGIOGRAPHY
Anesthesia: LOCAL

## 2022-08-11 MED ORDER — LABETALOL HCL 5 MG/ML IV SOLN: 10.0000 mg | INTRAVENOUS | Status: DC | PRN

## 2022-08-11 MED ORDER — SODIUM CHLORIDE 0.9 % IV SOLN: 250.0000 mL | INTRAVENOUS | Status: DC | PRN

## 2022-08-11 MED ORDER — VERAPAMIL HCL 2.5 MG/ML IV SOLN
INTRAVENOUS | Status: DC | PRN
  Administered 2022-08-11: 10 mL via INTRA_ARTERIAL

## 2022-08-11 MED ORDER — LIDOCAINE HCL (PF) 1 % IJ SOLN
INTRAMUSCULAR | Status: AC
  Filled 2022-08-11: qty 30

## 2022-08-11 MED ORDER — HEPARIN SODIUM (PORCINE) 1000 UNIT/ML IJ SOLN
INTRAMUSCULAR | Status: DC | PRN
  Administered 2022-08-11: 5000 [IU] via INTRAVENOUS

## 2022-08-11 MED ORDER — ASPIRIN 81 MG PO CHEW
81.0000 mg | CHEWABLE_TABLET | ORAL | Status: AC
  Administered 2022-08-11: 81 mg via ORAL
  Filled 2022-08-11: qty 1

## 2022-08-11 MED ORDER — SODIUM CHLORIDE 0.9% FLUSH: 3.0000 mL | INTRAVENOUS | Status: DC | PRN

## 2022-08-11 MED ORDER — OXYCODONE HCL 5 MG PO TABS: 5.0000 mg | ORAL_TABLET | ORAL | Status: DC | PRN

## 2022-08-11 MED ORDER — ACETAMINOPHEN 325 MG PO TABS: 650.0000 mg | ORAL_TABLET | ORAL | Status: DC | PRN

## 2022-08-11 MED ORDER — SODIUM CHLORIDE 0.9 % IV SOLN: INTRAVENOUS | Status: DC

## 2022-08-11 MED ORDER — FENTANYL CITRATE (PF) 100 MCG/2ML IJ SOLN
INTRAMUSCULAR | Status: DC | PRN
  Administered 2022-08-11: 25 ug via INTRAVENOUS

## 2022-08-11 MED ORDER — MIDAZOLAM HCL 2 MG/2ML IJ SOLN
INTRAMUSCULAR | Status: DC | PRN
  Administered 2022-08-11: 1 mg via INTRAVENOUS

## 2022-08-11 MED ORDER — SODIUM CHLORIDE 0.9% FLUSH: 3.0000 mL | Freq: Two times a day (BID) | INTRAVENOUS | Status: DC

## 2022-08-11 MED ORDER — HEPARIN SODIUM (PORCINE) 1000 UNIT/ML IJ SOLN
INTRAMUSCULAR | Status: AC
  Filled 2022-08-11: qty 10

## 2022-08-11 MED ORDER — MIDAZOLAM HCL 2 MG/2ML IJ SOLN
INTRAMUSCULAR | Status: AC
  Filled 2022-08-11: qty 2

## 2022-08-11 MED ORDER — VERAPAMIL HCL 2.5 MG/ML IV SOLN
INTRAVENOUS | Status: AC
  Filled 2022-08-11: qty 2

## 2022-08-11 MED ORDER — ONDANSETRON HCL 4 MG/2ML IJ SOLN: 4.0000 mg | Freq: Four times a day (QID) | INTRAMUSCULAR | Status: DC | PRN

## 2022-08-11 MED ORDER — HEPARIN (PORCINE) IN NACL 1000-0.9 UT/500ML-% IV SOLN
INTRAVENOUS | Status: DC | PRN
  Administered 2022-08-11 (×2): 500 mL

## 2022-08-11 MED ORDER — ASPIRIN 81 MG PO CHEW: 81.0000 mg | CHEWABLE_TABLET | ORAL | Status: DC

## 2022-08-11 MED ORDER — FENTANYL CITRATE (PF) 100 MCG/2ML IJ SOLN
INTRAMUSCULAR | Status: AC
  Filled 2022-08-11: qty 2

## 2022-08-11 MED ORDER — IOHEXOL 350 MG/ML SOLN
INTRAVENOUS | Status: DC | PRN
  Administered 2022-08-11: 90 mL

## 2022-08-11 MED ORDER — HEPARIN (PORCINE) IN NACL 1000-0.9 UT/500ML-% IV SOLN
INTRAVENOUS | Status: AC
  Filled 2022-08-11: qty 1000

## 2022-08-11 MED ORDER — HYDRALAZINE HCL 20 MG/ML IJ SOLN: 10.0000 mg | INTRAMUSCULAR | Status: DC | PRN

## 2022-08-11 MED ORDER — ASPIRIN 81 MG PO CHEW: 81.0000 mg | CHEWABLE_TABLET | Freq: Every day | ORAL | Status: DC

## 2022-08-11 SURGICAL SUPPLY — 12 items
CATH 5FR JL3.5 JR4 ANG PIG MP (CATHETERS) IMPLANT
DEVICE RAD COMP TR BAND LRG (VASCULAR PRODUCTS) IMPLANT
GLIDESHEATH SLEND A-KIT 6F 22G (SHEATH) IMPLANT
GUIDEWIRE INQWIRE 1.5J.035X260 (WIRE) IMPLANT
INQWIRE 1.5J .035X260CM (WIRE) ×1
KIT HEART LEFT (KITS) ×1 IMPLANT
PACK CARDIAC CATHETERIZATION (CUSTOM PROCEDURE TRAY) ×1 IMPLANT
PROTECTION STATION PRESSURIZED (MISCELLANEOUS) ×1
SHEATH PROBE COVER 6X72 (BAG) IMPLANT
STATION PROTECTION PRESSURIZED (MISCELLANEOUS) IMPLANT
TRANSDUCER W/STOPCOCK (MISCELLANEOUS) ×1 IMPLANT
TUBING CIL FLEX 10 FLL-RA (TUBING) ×1 IMPLANT

## 2022-08-11 NOTE — Discharge Instructions (Signed)

## 2022-08-11 NOTE — CV Procedure (Signed)
Totally occluded distal RCA Totally occluded proximal to mid circumflex Widely patent LAD with proximal to mid irregularities up to 50%. RCA and circumflex are poorly collateralized but visible with left coronary injection. Inferobasal akinesis.  EF less than 40%. Procedure from right radial approach

## 2022-08-11 NOTE — Interval H&P Note (Signed)
Cath Lab Visit (complete for each Cath Lab visit)  Clinical Evaluation Leading to the Procedure:   ACS: Yes.    Non-ACS:    Anginal Classification: CCS III  Anti-ischemic medical therapy: Minimal Therapy (1 class of medications)  Non-Invasive Test Results: No non-invasive testing performed  Prior CABG: No previous CABG      History and Physical Interval Note:  08/11/2022 10:22 AM  Grant Ochoa.  has presented today for surgery, with the diagnosis of chest pain.  The various methods of treatment have been discussed with the patient and family. After consideration of risks, benefits and other options for treatment, the patient has consented to  Procedure(s): LEFT HEART CATH AND CORONARY ANGIOGRAPHY (N/A) as a surgical intervention.  The patient's history has been reviewed, patient examined, no change in status, stable for surgery.  I have reviewed the patient's chart and labs.  Questions were answered to the patient's satisfaction.     Belva Crome III

## 2022-08-12 ENCOUNTER — Encounter (HOSPITAL_COMMUNITY): Payer: Self-pay | Admitting: Interventional Cardiology

## 2022-08-12 MED FILL — Lidocaine HCl Local Preservative Free (PF) Inj 1%: INTRAMUSCULAR | Qty: 30 | Status: AC

## 2022-08-19 NOTE — Progress Notes (Signed)
Office Visit    Patient Name: Grant Ochoa. Date of Encounter: 08/19/2022  Primary Care Provider:  Pcp, No Primary Cardiologist:  Jenkins Rouge, MD Primary Electrophysiologist: None  Chief Complaint    Grant Ochoa. is a 67 y.o. male with PMH of CAD s/p NSTEMI with stent to RCA in 2010, HTN, HLD, BPH, arthritis, tobacco abuse, syncope who presents today for post [facility] cath follow-up.  Past Medical History    Past Medical History:  Diagnosis Date   Arthritis    BPH (benign prostatic hyperplasia)    CAD (coronary artery disease)    Coronary artery disease    GAD (generalized anxiety disorder)    Hyperglycemia    Hypertension    Lightheadedness    Myocardial infarction [facility]) 2010   stent   Syncope    Tremor    Past Surgical History:  Procedure Laterality Date   ARTERY AND TENDON REPAIR Left 07/15/2020   Procedure: LEFT INDEX , LONG,AND RING FINGER EXPLORATION ,REPAIR TENDON,ARTERY AND NERVE;  Surgeon: Leanora Cover, MD;  Location: [facility];  Service: Orthopedics;  Laterality: Left;   CORONARY ANGIOPLASTY  2010   LEFT HEART CATH AND CORONARY ANGIOGRAPHY N/A 08/11/2022   Procedure: LEFT HEART CATH AND CORONARY ANGIOGRAPHY;  Surgeon: Belva Crome, MD;  Location: [place] [facility];  Service: Cardiovascular;  Laterality: N/A;   LIPOMA EXCISION  2012   forehead    Allergies  Allergies  Allergen Reactions   Chantix [Varenicline] Nausea Only    Chantix Crazy Dreams   Lipitor [Atorvastatin] Nausea Only    GI upset    History of Present Illness    Grant Ochoa.  is a 67 year old male with the above mention past medical history who presents today for post left heart cath follow-up.  Grant Ochoa was seen initially by Dr. Johnsie Cancel in 06/2022 for complaint of presyncope.  He has a previous history of stenting to the RCA in 2010 from NSTEMI.  Lexiscan Myoview was ordered and also carotid ultrasounds.  Myoview results showed high risk study with reduced systolic  function and EF of less than 30%.  He underwent 2D echo that revealed and confirmed decreased LV function and R/LHC was ordered for further evaluation of decreased LV function.  Procedure was completed 08/11/2022 by Dr. Tamala Julian and revealed total occlusion of distal RCA and proximal to mid circumflex with poor collateralization and widely patent LAD with proximal to mid irregularities up to 50%.  GDMT and aggressive risk factor modifications were recommended.  He is currently on Entresto 24/26 mg, Lopressor 25 mg, and Farxiga 10 mg  Grant Ochoa presents today for post heart catheterization follow-up alone.  Since last being seen in the office patient reports that he has been doing well with no recurrent chest discomfort or anginal equivalent.  He does endorse some shortness of breath with increased physical activity.  He is compliant with his current medications and denies any adverse reactions.  His blood pressure today is well-controlled at 126/72 and heart rate was 92 bpm.  He was euvolemic on examination today.  During today's visit we discussed his cath results and also reviewed his clinic appointment with the lipid clinic.  He had all questions answered to his satisfaction.  Patient denies chest pain, palpitations, dyspnea, PND, orthopnea, nausea, vomiting, dizziness, syncope, edema, weight gain, or early satiety.  Home Medications    Current Outpatient Medications  Medication Sig Dispense Refill   aspirin  EC 81 MG tablet Take 81 mg by mouth every evening. Swallow whole.     FARXIGA 10 MG TABS tablet Take 10 mg by mouth in the morning.     gabapentin (NEURONTIN) 100 MG capsule Take 2 capsules (200 mg total) by mouth at bedtime. 180 capsule 3   loratadine-pseudoephedrine (CLARITIN-D 12-HOUR) 5-120 MG tablet Take 1 tablet by mouth daily as needed for allergies.     metoprolol tartrate (LOPRESSOR) 25 MG tablet Take 25 mg by mouth every evening.     naproxen sodium (ALEVE) 220 MG tablet Take 440 mg by mouth  daily as needed (pain).     sacubitril-valsartan (ENTRESTO) 24-26 MG Take 1 tablet by mouth 2 (two) times daily. (Patient taking differently: Take 1 tablet by mouth in the morning.) 60 tablet 11   tadalafil (CIALIS) 5 MG tablet Take 5 mg by mouth every evening.     Current Facility-Administered Medications  Medication Dose Route Frequency Provider Last Rate Last Admin   sodium chloride flush (NS) 0.9 % injection 3 mL  3 mL Intravenous Q12H Josue Hector, MD         Review of Systems  Please see the history of present illness.    (All other systems reviewed and are otherwise negative except as noted above.  Physical Exam    Wt Readings from Last 3 Encounters:  08/11/22 215 lb (97.5 kg)  07/20/22 222 lb 6.4 oz (100.9 kg)  06/28/22 225 lb (102.1 kg)   TD:VVOHY were no vitals filed for this visit.,There is no height or weight on file to calculate BMI.  Constitutional:      Appearance: Healthy appearance. Not in distress.  Neck:     Vascular: JVD normal.  Pulmonary:     Effort: Pulmonary effort is normal.     Breath sounds: No wheezing. No rales. Diminished in the bases Cardiovascular:     Normal rate. Regular rhythm. Normal S1. Normal S2.      Murmurs: There is no murmur.  Edema:    Peripheral edema absent.  Abdominal:     Palpations: Abdomen is soft non tender. There is no hepatomegaly.  Skin:    General: Skin is warm and dry.  Neurological:     General: No focal deficit present.     Mental Status: Alert and oriented to person, place and time.     Cranial Nerves: Cranial nerves are intact.  EKG/LABS/Other Studies Reviewed    ECG personally reviewed by me today -completed today   Lab Results  Component Value Date   WBC 7.8 08/06/2022   HGB 18.1 (H) 08/06/2022   HCT 53.9 (H) 08/06/2022   MCV 91 08/06/2022   PLT 240 08/06/2022   Lab Results  Component Value Date   CREATININE 1.12 08/06/2022   BUN 12 08/06/2022   NA 138 08/06/2022   K 4.4 08/06/2022   CL 100  08/06/2022   CO2 23 08/06/2022   No results found for: "ALT", "AST", "GGT", "ALKPHOS", "BILITOT" No results found for: "CHOL", "HDL", "LDLCALC", "LDLDIRECT", "TRIG", "CHOLHDL"  No results found for: "HGBA1C"  Assessment & Plan    1.  Coronary artery disease: -s/p R/LHC with total occlusions noted at distal RCA and proximal to mid circumflex with poor collateralization. -Today patient reports no chest pain or shortness of breath -Continue GDMT with ASA 81 mg and omega fish oil 1200 mg capsule. -Patient has scheduled appointment with lipid clinic to discuss PCSK9 inhibitor  2.  HFrEF/ICM: -2D echo  and recent [facility] revealed decreased LV function with inferior basal akinesis and EF less than 40%. -Today patient reports some shortness of breath with exertion.  He is euvolemic on exam and reports full compliance with his current medication regimen. -We will increase Entresto to 49/51 mg twice daily, and add Aldactone 12.5 mg daily, -Continue GDMT with Farxiga 10 mg daily, Lopressor 25 mg daily  3.  Hyperlipidemia: -Patient's LDL cholesterol was elevated and patient is scheduled to be seen and lipid clinic for discussion of PCSK9 inhibitors.  4.  Hypertension: -Patient's blood pressure today was well-controlled at 126/72 -Continue metoprolol and spironolactone as noted above  5.  Tobacco abuse: -Patient is currently motivated to stop smoking and was offered 1 800 quit now for support. -Patient is interested in starting Wellbutrin in the future for smoking cessation support and anxiety.  Disposition: Follow-up with Charlton Haws, MD or APP in as scheduled     Medication Adjustments/Labs and Tests Ordered: Current medicines are reviewed at length with the patient today.  Concerns regarding medicines are outlined above.   Signed, Napoleon Form, Leodis Rains, NP 08/19/2022, 7:21 PM [facility]  Note:  This document was prepared using Dragon voice recognition software  and may include unintentional dictation errors.

## 2022-08-20 ENCOUNTER — Encounter: Payer: Self-pay | Admitting: Nurse Practitioner

## 2022-08-20 ENCOUNTER — Ambulatory Visit: Payer: BC Managed Care – PPO | Attending: Nurse Practitioner | Admitting: Nurse Practitioner

## 2022-08-20 VITALS — BP 126/72 | HR 92 | Ht 70.0 in | Wt 226.6 lb

## 2022-08-20 DIAGNOSIS — I5020 Unspecified systolic (congestive) heart failure: Secondary | ICD-10-CM

## 2022-08-20 DIAGNOSIS — I255 Ischemic cardiomyopathy: Secondary | ICD-10-CM | POA: Diagnosis not present

## 2022-08-20 DIAGNOSIS — I25118 Atherosclerotic heart disease of native coronary artery with other forms of angina pectoris: Secondary | ICD-10-CM

## 2022-08-20 DIAGNOSIS — E782 Mixed hyperlipidemia: Secondary | ICD-10-CM

## 2022-08-20 DIAGNOSIS — I10 Essential (primary) hypertension: Secondary | ICD-10-CM

## 2022-08-20 MED ORDER — SPIRONOLACTONE 25 MG PO TABS: 12.5000 mg | ORAL_TABLET | Freq: Every day | ORAL | 1 refills | Status: DC

## 2022-08-20 MED ORDER — ENTRESTO 49-51 MG PO TABS: 1.0000 | ORAL_TABLET | Freq: Two times a day (BID) | ORAL | 3 refills | Status: DC

## 2022-08-20 NOTE — Patient Instructions (Addendum)
Medication Instructions:  INCREASE Entresto 49/51mg  Take 1 tablet twice a day START Spironolactone 12.5mg  Take 1 tablet once a day (tablet comes in 25mg  cut in half and take half tablet daily) *If you need a refill on your cardiac medications before your next appointment, please call your pharmacy*   Lab Work: 2 weeks BMET If you have labs (blood work) drawn today and your tests are completely normal, you will receive your results only by: [facility] (if you have MyChart) OR A paper copy in the mail If you have any lab test that is abnormal or we need to change your treatment, we will call you to review the results.   Testing/Procedures: NONE ORDERED   Follow-Up: At [facility], you and your health needs are our priority.  As part of our continuing mission to provide you with exceptional heart care, we have created designated Provider Care Teams.  These Care Teams include your primary Cardiologist (physician) and Advanced Practice Providers (APPs -  Physician Assistants and Nurse Practitioners) who all work together to provide you with the care you need, when you need it.  We recommend signing up for the patient portal called "MyChart".  Sign up information is provided on this After Visit Summary.  MyChart is used to connect with patients for Virtual Visits (Telemedicine).  Patients are able to view lab/test results, encounter notes, upcoming appointments, etc.  Non-urgent messages can be sent to your provider as well.   To learn more about what you can do with MyChart, go to [email or web address].    Your next appointment:    AS SCHEDULED  Provider:   Jenkins Rouge, MD     Other Instructions CHECK YOUR WEIGHT DAILY CONTACT OFFICE IF YOU GAIN MORE THAN 2 LBS IN A DAY OR 5LBS IN A WEEK 1-800-QUIT-NOW

## 2022-08-27 ENCOUNTER — Ambulatory Visit: Payer: BC Managed Care – PPO

## 2022-08-31 ENCOUNTER — Ambulatory Visit: Payer: BC Managed Care – PPO | Attending: Cardiovascular Disease | Admitting: Pharmacist

## 2022-08-31 ENCOUNTER — Telehealth: Payer: Self-pay | Admitting: Pharmacist

## 2022-08-31 ENCOUNTER — Ambulatory Visit: Payer: BC Managed Care – PPO

## 2022-08-31 DIAGNOSIS — E785 Hyperlipidemia, unspecified: Secondary | ICD-10-CM | POA: Diagnosis not present

## 2022-08-31 DIAGNOSIS — I10 Essential (primary) hypertension: Secondary | ICD-10-CM

## 2022-08-31 DIAGNOSIS — E782 Mixed hyperlipidemia: Secondary | ICD-10-CM

## 2022-08-31 DIAGNOSIS — I5020 Unspecified systolic (congestive) heart failure: Secondary | ICD-10-CM | POA: Diagnosis not present

## 2022-08-31 DIAGNOSIS — I25118 Atherosclerotic heart disease of native coronary artery with other forms of angina pectoris: Secondary | ICD-10-CM | POA: Diagnosis not present

## 2022-08-31 DIAGNOSIS — I255 Ischemic cardiomyopathy: Secondary | ICD-10-CM

## 2022-08-31 MED ORDER — REPATHA SURECLICK 140 MG/ML ~~LOC~~ SOAJ: 1.0000 mL | SUBCUTANEOUS | 11 refills | Status: DC

## 2022-08-31 NOTE — Patient Instructions (Signed)
I will submit a prior authorization for Repatha. I will call you once I hear back. Please call me at [phone] with any questions.   Repatha is a cholesterol medication that improved your body's ability to get rid of "bad cholesterol" known as LDL. It can lower your LDL up to 60%! It is an injection that is given under the skin every 2 weeks. The medication often requires a prior authorization from your insurance company. We will take care of submitting all the necessary information to your insurance company to get it approved. The most common side effects of Repatha include runny nose, symptoms of the common cold, rarely flu or flu-like symptoms, back/muscle pain in about 3-4% of the patients, and redness, pain, or bruising at the injection site. Tell your healthcare provider if you have any side effect that bothers you or that does not go away.     To get your $5 Repatha copay card; [email or web address] Paying for Repatha (white bar across top).  Scroll down to "options for insurance situations" and click on "I have commercial or private insurance" - scroll down and click on the blue highlighted  "click here" to learn more about the Repatha Copay Card Do you have a prescription - Click YES then scroll down and mark the box "Repatha Copay Card", then continue to scroll down and enter the personal information. There are two blue boxes with "I agree" next to them. The first is optional if you want to enroll in their patient support program.  This one is voluntary.  The second is their patient authorization, and you must mark this one to continue.   Lastly they ask if they can contact you regarding information about market research about the drug or disease state.  You can mark either box. Click "next" and continue to follow steps to get your copay card  Adopting a Healthy Lifestyle.   Weight: Know what a healthy weight is for you (roughly BMI <25) and aim to maintain this. You can calculate your body mass  index on your smart phone  Diet: Aim for 7+ servings of fruits and vegetables daily Limit animal fats in diet for cholesterol and heart health - choose grass fed whenever available Avoid highly processed foods (fast food burgers, tacos, fried chicken, pizza, hot dogs, french fries)  Saturated fat comes in the form of butter, lard, coconut oil, margarine, partially hydrogenated oils, and fat in meat. These increase your risk of cardiovascular disease.  Use healthy plant oils, such as olive, canola, soy, corn, sunflower and peanut.  Whole foods such as fruits, vegetables and whole grains have fiber  Men need > 38 grams of fiber per day Women need > 25 grams of fiber per day  Load up on vegetables and fruits - one-half of your plate: Aim for color and variety, and remember that potatoes dont count. Go for whole grains - one-quarter of your plate: Whole wheat, barley, wheat berries, quinoa, oats, brown rice, and foods made with them. If you want pasta, go with whole wheat pasta. Protein power - one-quarter of your plate: Fish, chicken, beans, and nuts are all healthy, versatile protein sources. Limit red meat. You need carbohydrates for energy! The type of carbohydrate is more important than the amount. Choose carbohydrates such as vegetables, fruits, whole grains, beans, and nuts in the place of white rice, white pasta, potatoes (baked or fried), macaroni and cheese, cakes, cookies, and donuts.  If youre thirsty, drink water. Coffee and tea are   good in moderation, but skip sugary drinks and limit milk and dairy products to one or two daily servings. Keep sugar intake at 6 teaspoons or 24 grams or LESS       Exercise: Aim for 150 min of moderate intensity exercise weekly for heart health, and weights twice weekly for bone health Stay active - any steps are better than no steps! Aim for 7-9 hours of sleep daily       

## 2022-08-31 NOTE — Progress Notes (Signed)
Patient ID: Grant Ochoa.                 DOB: 1956-05-19                    MRN: [id number]      HPI: Grant Ochoa. is a 67 y.o. male patient of Grant Ochoa referred to lipid clinic by  Grant Pancoast, Grant Ochoa. PMH is significant for STEMI (2010), HFrEF, HTN, tobacco abuse, DM, BPH and recent [facility] with total occlusions noted at distal RAC and proximal to mid circumflex with poor collateralization.   Patient presents to lipid clinic today.  He reports intolerance to statins.  States it has been a long time since he is taking them.  Reports a flulike reaction to atorvastatin and rosuvastatin made him feel bad.  Most recently was on Zetia but he stopped taking it because people told him that it was not worth his money due to his low LDL-C reduction and his high LDL-C baseline.  He admits that his diet very poor, eats a lot of fast food or microwave meals.  Does not do regular exercise, he does walk in the woods when the weather is nice[place]  [place]  Back hurts when he walks on flat ground.  He still is smoking, interested in quitting but uses smoking as a stress reliever from his stressful job.  He had discussed with Grant Ochoa using bupropion, we discussed this again today and are waiting for patient to be ready.  Reviewed options for lowering LDL cholesterol, including ezetimibe, PCSK-9 inhibitors, bempedoic acid and inclisiran.  Discussed mechanisms of action, dosing, side effects and potential decreases in LDL cholesterol.  Also reviewed cost information and potential options for patient assistance.   Current Medications: none Intolerances: atorvastatin (nausea, flu like reaction) rosuvastatin (made him feel bad) Risk Factors: progressive, premature disease, HTN, DM LDL-C goal: <55 ApoB goal: <70  Diet:  Fast food, processed food  Exercise: hikes in the warmer weather  Family History:  Family History  Problem Relation Age of Onset   Hypertension Mother    Hypertension  Father    Aortic aneurysm Father     Social History: + tobacco, rare ETOH (2 drinks every month or so), no illict drugs  Labs: Lipid Panel  06/04/22 TC 272, TG 265, LDL-D 213, non-HDL 240 A1C: 6.9  No results found for: "CHOL", "TRIG", "HDL", "CHOLHDL", "VLDL", "LDLCALC", "LDLDIRECT", "LABVLDL"  Past Medical History:  Diagnosis Date   Arthritis    BPH (benign prostatic hyperplasia)    CAD (coronary artery disease)    Coronary artery disease    GAD (generalized anxiety disorder)    Hyperglycemia    Hypertension    Lightheadedness    Myocardial infarction ([place]) 2010   stent   Syncope    Tremor     Current Outpatient Medications on File Prior to Visit  Medication Sig Dispense Refill   aspirin EC 81 MG tablet Take 81 mg by mouth every evening. Swallow whole.     FARXIGA 10 MG TABS tablet Take 10 mg by mouth in the morning.     loratadine-pseudoephedrine (CLARITIN-D 12-HOUR) 5-120 MG tablet Take 1 tablet by mouth daily as needed for allergies.     metoprolol tartrate (LOPRESSOR) 25 MG tablet Take 25 mg by mouth every evening.     sacubitril-valsartan (ENTRESTO) 49-51 MG Take 1 tablet by mouth 2 (two) times daily. 60 tablet 3   spironolactone (  ALDACTONE) 25 MG tablet Take 0.5 tablets (12.5 mg total) by mouth daily. 90 tablet 1   tadalafil (CIALIS) 5 MG tablet Take 5 mg by mouth every evening.     naproxen sodium (ALEVE) 220 MG tablet Take 440 mg by mouth daily as needed (pain).     Omega-3 Fatty Acids (FISH OIL) 1200 MG CAPS Take 1 capsule by mouth daily at 6 (six) AM.     Current Facility-Administered Medications on File Prior to Visit  Medication Dose Route Frequency Provider Last Rate Last Admin   sodium chloride flush (NS) 0.9 % injection 3 mL  3 mL Intravenous Q12H Grant Hector, MD        Allergies  Allergen Reactions   Chantix [Varenicline] Nausea Only    Chantix Crazy Dreams   Lipitor [Atorvastatin] Nausea Only    GI upset    Assessment/Plan:  1.  Hyperlipidemia -  Hyperlipidemia Assessment: LDL-C is significantly elevated as are triglycerides We discussed modifiable risk factors for ASCVD.  Blood pressure is well-controlled, LDL-C and triglycerides are significantly elevated.  Patient admits to having a poor diet and he does not exercise.  He is still smoking. I encouraged the patient to increase the amount of vegetables in his diet, to limit fast food and to limit fried foods I encouraged him to incorporate more physical activity into his daily habits.  Encouraged him to walk when he feels stressed at work instead of smoking Advised patient that when he is ready to quit and if he would like to use Wellbutrin to reach out to [place] and we can prescribe this for him.  Of note he does not take the Prozac that was prescribed by his PCP I do think that he is going to need more than PCSK9 inhibitor, but I think this is a good starting point and then can discuss add-on therapy after repeat labs Reviewed pros and cons PCSK9, Leqvio, Nexletol, low-dose statin with patient Reviewed injection technique, side effects and cost  Plan: Will submit prior authorization for Repatha Patient instructed to get co-pay card online Will contact patient once I hear back from insurance and he will need labs set up for 2 to 3 months.   Thank you,  Grant Ochoa, Pharm.D, BCPS, CPP [facility] of [facility] [address] [place], [place] [address]  Phone: [phone]; Fax: [phone]

## 2022-08-31 NOTE — Assessment & Plan Note (Signed)
Assessment: LDL-C is significantly elevated as are triglycerides We discussed modifiable risk factors for ASCVD.  Blood pressure is well-controlled, LDL-C and triglycerides are significantly elevated.  Patient admits to having a poor diet and he does not exercise.  He is still smoking. I encouraged the patient to increase the amount of vegetables in his diet, to limit fast food and to limit fried foods I encouraged him to incorporate more physical activity into his daily habits.  Encouraged him to walk when he feels stressed at work instead of smoking Advised patient that when he is ready to quit and if he would like to use Wellbutrin to reach out to [place] and we can prescribe this for him.  Of note he does not take the Prozac that was prescribed by his PCP I do think that he is going to need more than PCSK9 inhibitor, but I think this is a good starting point and then can discuss add-on therapy after repeat labs Reviewed pros and cons PCSK9, Leqvio, Nexletol, low-dose statin with patient Reviewed injection technique, side effects and cost  Plan: Will submit prior authorization for Repatha Patient instructed to get co-pay card online Will contact patient once I hear back from insurance and he will need labs set up for 2 to 3 months.

## 2022-08-31 NOTE — Telephone Encounter (Signed)
Attempted PA for Repatha Came back as available without Auth Will send Rx to pharmacy

## 2022-09-01 LAB — BASIC METABOLIC PANEL
BUN/Creatinine Ratio: 16 (ref 10–24)
BUN: 16 mg/dL (ref 8–27)
CO2: 21 mmol/L (ref 20–29)
Calcium: 9.4 mg/dL (ref 8.6–10.2)
Chloride: 100 mmol/L (ref 96–106)
Creatinine, Ser: 1.01 mg/dL (ref 0.76–1.27)
Glucose: 121 mg/dL — ABNORMAL HIGH (ref 70–99)
Potassium: 4.5 mmol/L (ref 3.5–5.2)
Sodium: 139 mmol/L (ref 134–144)
eGFR: 83 mL/min/{1.73_m2} (ref >59)

## 2022-09-01 NOTE — Telephone Encounter (Signed)
Called patient and left a detailed message per DPR that his Repatha came back with no authorization needed.  Rx sent to pharmacy.  Patient reminded to get a co-pay card from [email or web address].  He is to call with any issues.  I requested he call back to schedule labs for 2 to 3 months from now.

## 2022-09-02 ENCOUNTER — Other Ambulatory Visit: Payer: Self-pay

## 2022-09-02 MED ORDER — ENTRESTO 24-26 MG PO TABS: 1.0000 | ORAL_TABLET | Freq: Two times a day (BID) | ORAL | 3 refills | Status: DC

## 2022-09-02 NOTE — Telephone Encounter (Signed)
Rx(s) sent to pharmacy electronically.

## 2022-09-02 NOTE — Telephone Encounter (Signed)
Called pt in regards to Entresto 49/51 mg.  Reports has felt terrible since dose of Entresto was increased.  Notes nausea, lightheaded, and weak. First thought it symptoms were r/t Wilder Glade so did not take yesterday and still felt bad.  Felt so bad was considering going to Urgent Care.  Didn't take Entresto last night or this morning and feels fine.  Feels that dose of Entresto is too much.  BP today 108/71 Advised pt will send message to [name] to review.

## 2022-10-06 ENCOUNTER — Ambulatory Visit: Payer: BC Managed Care – PPO | Admitting: Cardiovascular Disease

## 2022-10-26 NOTE — Progress Notes (Signed)
CARDIOLOGY CONSULT NOTE       Patient ID: Grant Ochoa. MRN: [id number] DOB/AGE: 02/11/1956 67 y.o.  Admit date: (Not on file) Referring Physician: Bebe Shaggy Primary Physician: Pcp, No Primary Cardiologist: Johnsie Cancel    HPI:  67 y.o. 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   Myovue done 07/09/22 showed old IMI estimated EF 29% Carotid with bilateral 40-59% ICA stenosis same day TTE 07/16/22 EF 30-35% trivial MR  Cath 08/11/22 showed CTO of RCA proximal to prior stent with weak left to right collaterals CTO mid LCX after OM 1 and diffuse 40-50% LAD Medical Rx thought best  GDMT with ASA, Repatha, lopressor, Farxiga , aldactone and Entresto Did not tolerate mid dose Entresto   Doing well no edema compliant with meds  ROS All other systems reviewed and negative except as noted above  Past Medical History:  Diagnosis Date   Arthritis    BPH (benign prostatic hyperplasia)    CAD  (coronary artery disease)    Coronary artery disease    GAD (generalized anxiety disorder)    Hyperglycemia    Hypertension    Lightheadedness    Myocardial infarction ([place]) 2010   stent   Syncope    Tremor     Family History  Problem Relation Age of Onset   Hypertension Mother    Hypertension Father    Aortic aneurysm Father     Social History   Socioeconomic History   Marital status: Married    Spouse name: Not on file   Number of children: Not on file   Years of education: Not on file   Highest education level: Not on file  Occupational History   Not on file  Tobacco Use   Smoking status: Every Day    Packs/day: 1    Types: Cigarettes   Smokeless tobacco: Never  Vaping Use   Vaping Use: Some days  Substance and Sexual Activity   Alcohol use: Not Currently   Drug use: Never   Sexual activity: Not on file  Other Topics Concern   Not on file  Social History Narrative   Not on file   Social Determinants of Health   Financial Resource Strain: Not on file  Food Insecurity: Not on file  Transportation Needs: Not on file  Physical Activity: Not on file  Stress: Not on file  Social Connections:  Not on file  Intimate Partner Violence: Not on file    Past Surgical History:  Procedure Laterality Date   ARTERY AND TENDON REPAIR Left 07/15/2020   Procedure: LEFT INDEX , LONG,AND RING FINGER EXPLORATION ,REPAIR TENDON,ARTERY AND NERVE;  Surgeon: Leanora Cover, MD;  Location: [facility];  Service: Orthopedics;  Laterality: Left;   CORONARY ANGIOPLASTY  2010   LEFT HEART CATH AND CORONARY ANGIOGRAPHY N/A 08/11/2022   Procedure: LEFT HEART CATH AND CORONARY ANGIOGRAPHY;  Surgeon: Belva Crome, MD;  Location: [place] [facility];  Service: Cardiovascular;  Laterality: N/A;   LIPOMA EXCISION  2012   forehead      Current Outpatient Medications:    aspirin EC 81 MG tablet, Take 81 mg by mouth every evening. Swallow whole., Disp: , Rfl:    Evolocumab (REPATHA SURECLICK) [id number]  MG/ML SOAJ, Inject 140 mg into the skin every 14 (fourteen) days., Disp: 2 mL, Rfl: 11   FARXIGA 10 MG TABS tablet, Take 10 mg by mouth in the morning., Disp: , Rfl:    loratadine-pseudoephedrine (CLARITIN-D 12-HOUR) 5-120 MG tablet, Take 1 tablet by mouth daily as needed for allergies., Disp: , Rfl:    metoprolol tartrate (LOPRESSOR) 25 MG tablet, Take 25 mg by mouth every evening., Disp: , Rfl:    naproxen sodium (ALEVE) 220 MG tablet, Take 440 mg by mouth daily as needed (pain)., Disp: , Rfl:    Omega-3 Fatty Acids (FISH OIL) 1200 MG CAPS, Take 1 capsule by mouth daily at 6 (six) AM., Disp: , Rfl:    sacubitril-valsartan (ENTRESTO) 24-26 MG, Take 1 tablet by mouth 2 (two) times daily., Disp: 60 tablet, Rfl: 3   spironolactone (ALDACTONE) 25 MG tablet, Take 0.5 tablets (12.5 mg total) by mouth daily., Disp: 90 tablet, Rfl: 1   tadalafil (CIALIS) 5 MG tablet, Take 5 mg by mouth every evening., Disp: , Rfl:   Current Facility-Administered Medications:    sodium chloride flush (NS) 0.9 % injection 3 mL, 3 mL, Intravenous, Q12H, Josue Hector, MD    Physical Exam: Blood pressure 124/60, pulse 76, height 5\' 10"  (1.778 m), weight 225 lb 3.2 oz (102.2 kg), SpO2 98 %.    Affect appropriate Chronically ill male HEENT: normal Neck supple with no adenopathy JVP normal bilateral bruits no thyromegaly Lungs clear with no wheezing and good diaphragmatic motion Heart:  S1/S2 SEM murmur, no rub, gallop or click PMI normal Abdomen: benighn, BS positve, no tenderness, no AAA no bruit.  No HSM or HJR Distal pulses palpable  No edema Neuro non-focal Skin warm and dry No muscular weakness  Labs:   Lab Results  Component Value Date   WBC 7.8 08/06/2022   HGB 18.1 (H) 08/06/2022   HCT 53.9 (H) 08/06/2022   MCV 91 08/06/2022   PLT 240 08/06/2022   No results for input(s): "NA", "K", "CL", "CO2", "BUN", "CREATININE", "CALCIUM", "PROT", "BILITOT", "ALKPHOS", "ALT", "AST", "GLUCOSE" in the last  168 hours.  Invalid input(s): "LABALBU" No results found for: "CKTOTAL", "CKMB", "CKMBINDEX", "TROPONINI" No results found for: "CHOL" No results found for: "HDL" No results found for: "LDLCALC" No results found for: "TRIG" No results found for: "CHOLHDL" No results found for: "LDLDIRECT"    Radiology: No results found.  EKG: SR rate 72 LAD RBBB PR 212 msec ? Old IMI    ASSESSMENT AND PLAN:   CAD:  distant stent to RCA in 2010 occluded RCA/LCX with moderate LAD dx by cath 08/11/22  HLD:  refer  to lipid clinic needs to be on low dose statin and PSK9 Smoking: counseled on cessation < 10 minutes Lung cancer CT Dizziness: does not sound like arrhythmia ? Vertigo carotids not high grade and antegrade vertebral flow  HTN:  continue current meds Bruit:  bilateral 40-59% ICA stenosis f/u duplex 07/2023 Ischemic DCM:  will order cardiac MRI to quantitative EF and see if he needs consideration for AICD   Cardiac MRI   F/U in 3 months   Signed: Jenkins Rouge 11/01/2022, 3:03 PM

## 2022-11-01 ENCOUNTER — Encounter: Payer: Self-pay | Admitting: Cardiovascular Disease

## 2022-11-01 ENCOUNTER — Ambulatory Visit: Payer: BC Managed Care – PPO | Attending: Cardiovascular Disease | Admitting: Cardiovascular Disease

## 2022-11-01 VITALS — BP 124/60 | HR 76 | Ht 70.0 in | Wt 225.2 lb

## 2022-11-01 DIAGNOSIS — I5020 Unspecified systolic (congestive) heart failure: Secondary | ICD-10-CM | POA: Diagnosis not present

## 2022-11-01 DIAGNOSIS — I255 Ischemic cardiomyopathy: Secondary | ICD-10-CM | POA: Diagnosis not present

## 2022-11-01 DIAGNOSIS — I10 Essential (primary) hypertension: Secondary | ICD-10-CM

## 2022-11-01 NOTE — Patient Instructions (Addendum)
Medication Instructions:  Your physician recommends that you continue on your current medications as directed. Please refer to the Current Medication list given to you today.   *If you need a refill on your cardiac medications before your next appointment, please call your pharmacy*  Lab Work: Your physician recommends that you return for lab work in: [place]  If you have labs (blood work) drawn today and your tests are completely normal, you will receive your results only by: [place] (if you have MyChart) OR A paper copy in the mail If you have any lab test that is abnormal or we need to change your treatment, we will call you to review the results.  Testing/Procedures: Your physician has requested that you have a cardiac MRI IN [facility] Cardiac MRI uses a computer to create images of your heart as its beating, producing both still and moving pictures of your heart and major blood vessels. For further information please visit [email or web address] Please follow the instruction sheet given to you today for more information.   Follow-Up: At [facility], you and your health needs are our priority.  As part of our continuing mission to provide you with exceptional heart care, we have created designated Provider Care Teams.  These Care Teams include your primary Cardiologist (physician) and Advanced Practice Providers (APPs -  Physician Assistants and Nurse Practitioners) who all work together to provide you with the care you need, when you need it.  We recommend signing up for the patient portal called "MyChart".  Sign up information is provided on this After Visit Summary.  MyChart is used to connect with patients for Virtual Visits (Telemedicine).  Patients are able to view lab/test results, encounter notes, upcoming appointments, etc.  Non-urgent messages can be sent to your provider as well.   To learn more about what you can do with MyChart, go to [email or web address].     Your next appointment:   3 month(s)  Provider:   Jenkins Rouge, MD

## 2022-11-02 ENCOUNTER — Encounter: Payer: Self-pay | Admitting: Pharmacist

## 2022-11-03 ENCOUNTER — Other Ambulatory Visit: Payer: Self-pay | Admitting: Pharmacist

## 2022-11-03 ENCOUNTER — Telehealth: Payer: Self-pay | Admitting: Pharmacist

## 2022-11-03 DIAGNOSIS — E785 Hyperlipidemia, unspecified: Secondary | ICD-10-CM

## 2022-11-03 NOTE — Telephone Encounter (Signed)
Patient is taking repatha. He will get labs done in [place] a few days before his MRI. Will place orders.

## 2022-11-22 ENCOUNTER — Emergency Department (HOSPITAL_COMMUNITY): Payer: BC Managed Care – PPO

## 2022-11-22 ENCOUNTER — Encounter (HOSPITAL_COMMUNITY): Payer: Self-pay

## 2022-11-22 ENCOUNTER — Inpatient Hospital Stay (HOSPITAL_COMMUNITY)
Admission: EM | Admit: 2022-11-22 | Discharge: 2022-11-25 | DRG: 277 | Disposition: A | Discharge status: Home / Self Care | Payer: BC Managed Care – PPO | Attending: Cardiovascular Disease | Admitting: Cardiovascular Disease

## 2022-11-22 ENCOUNTER — Other Ambulatory Visit: Payer: Self-pay

## 2022-11-22 DIAGNOSIS — I5A Non-ischemic myocardial injury (non-traumatic): Secondary | ICD-10-CM | POA: Diagnosis present

## 2022-11-22 DIAGNOSIS — Z79899 Other long term (current) drug therapy: Secondary | ICD-10-CM

## 2022-11-22 DIAGNOSIS — I2582 Chronic total occlusion of coronary artery: Secondary | ICD-10-CM | POA: Diagnosis not present

## 2022-11-22 DIAGNOSIS — I252 Old myocardial infarction: Secondary | ICD-10-CM | POA: Diagnosis not present

## 2022-11-22 DIAGNOSIS — I2510 Atherosclerotic heart disease of native coronary artery without angina pectoris: Secondary | ICD-10-CM | POA: Diagnosis present

## 2022-11-22 DIAGNOSIS — I110 Hypertensive heart disease with heart failure: Secondary | ICD-10-CM | POA: Diagnosis not present

## 2022-11-22 DIAGNOSIS — N179 Acute kidney failure, unspecified: Secondary | ICD-10-CM | POA: Diagnosis not present

## 2022-11-22 DIAGNOSIS — Z7984 Long term (current) use of oral hypoglycemic drugs: Secondary | ICD-10-CM | POA: Diagnosis not present

## 2022-11-22 DIAGNOSIS — N400 Enlarged prostate without lower urinary tract symptoms: Secondary | ICD-10-CM | POA: Diagnosis present

## 2022-11-22 DIAGNOSIS — Z955 Presence of coronary angioplasty implant and graft: Secondary | ICD-10-CM

## 2022-11-22 DIAGNOSIS — R0602 Shortness of breath: Secondary | ICD-10-CM | POA: Diagnosis not present

## 2022-11-22 DIAGNOSIS — E785 Hyperlipidemia, unspecified: Secondary | ICD-10-CM | POA: Diagnosis not present

## 2022-11-22 DIAGNOSIS — Z950 Presence of cardiac pacemaker: Secondary | ICD-10-CM | POA: Diagnosis not present

## 2022-11-22 DIAGNOSIS — I442 Atrioventricular block, complete: Principal | ICD-10-CM | POA: Diagnosis present

## 2022-11-22 DIAGNOSIS — I5042 Chronic combined systolic (congestive) and diastolic (congestive) heart failure: Secondary | ICD-10-CM | POA: Diagnosis not present

## 2022-11-22 DIAGNOSIS — Z8249 Family history of ischemic heart disease and other diseases of the circulatory system: Secondary | ICD-10-CM

## 2022-11-22 DIAGNOSIS — I5022 Chronic systolic (congestive) heart failure: Secondary | ICD-10-CM | POA: Diagnosis not present

## 2022-11-22 DIAGNOSIS — F17210 Nicotine dependence, cigarettes, uncomplicated: Secondary | ICD-10-CM | POA: Diagnosis not present

## 2022-11-22 DIAGNOSIS — R079 Chest pain, unspecified: Secondary | ICD-10-CM | POA: Diagnosis not present

## 2022-11-22 DIAGNOSIS — I255 Ischemic cardiomyopathy: Secondary | ICD-10-CM | POA: Diagnosis present

## 2022-11-22 DIAGNOSIS — Z7982 Long term (current) use of aspirin: Secondary | ICD-10-CM | POA: Diagnosis not present

## 2022-11-22 DIAGNOSIS — R531 Weakness: Secondary | ICD-10-CM | POA: Diagnosis not present

## 2022-11-22 DIAGNOSIS — I959 Hypotension, unspecified: Secondary | ICD-10-CM | POA: Diagnosis not present

## 2022-11-22 DIAGNOSIS — I509 Heart failure, unspecified: Secondary | ICD-10-CM | POA: Diagnosis not present

## 2022-11-22 DIAGNOSIS — Z888 Allergy status to other drugs, medicaments and biological substances status: Secondary | ICD-10-CM | POA: Diagnosis not present

## 2022-11-22 DIAGNOSIS — R0789 Other chest pain: Secondary | ICD-10-CM | POA: Diagnosis not present

## 2022-11-22 LAB — BASIC METABOLIC PANEL
Anion gap: 12 (ref 5–15)
BUN: 18 mg/dL (ref 8–23)
CO2: 21 mmol/L — ABNORMAL LOW (ref 22–32)
Calcium: 9 mg/dL (ref 8.9–10.3)
Chloride: 103 mmol/L (ref 98–111)
Creatinine, Ser: 1.41 mg/dL — ABNORMAL HIGH (ref 0.61–1.24)
GFR, Estimated: 55 mL/min — ABNORMAL LOW (ref >60)
Glucose, Bld: 160 mg/dL — ABNORMAL HIGH (ref 70–99)
Potassium: 4.1 mmol/L (ref 3.5–5.1)
Sodium: 136 mmol/L (ref 135–145)

## 2022-11-22 LAB — CBC
HCT: 43.8 % (ref 39.0–52.0)
Hemoglobin: 15.5 g/dL (ref 13.0–17.0)
MCH: 31.3 pg (ref 26.0–34.0)
MCHC: 35.4 g/dL (ref 30.0–36.0)
MCV: 88.3 fL (ref 80.0–100.0)
Platelets: 199 10*3/uL (ref 150–400)
RBC: 4.96 MIL/uL (ref 4.22–5.81)
RDW: 13.4 % (ref 11.5–15.5)
WBC: 8.4 10*3/uL (ref 4.0–10.5)
nRBC: 0 % (ref 0.0–0.2)

## 2022-11-22 LAB — TSH: TSH: 2.304 u[IU]/mL (ref 0.350–4.500)

## 2022-11-22 LAB — MAGNESIUM: Magnesium: 2 mg/dL (ref 1.7–2.4)

## 2022-11-22 LAB — MRSA NEXT GEN BY PCR, NASAL: MRSA by PCR Next Gen: NOT DETECTED

## 2022-11-22 LAB — TROPONIN I (HIGH SENSITIVITY)
Troponin I (High Sensitivity): 54 ng/L — ABNORMAL HIGH (ref <18)
Troponin I (High Sensitivity): 52 ng/L — ABNORMAL HIGH (ref <18)

## 2022-11-22 MED ORDER — SODIUM CHLORIDE 0.9% FLUSH
3.0000 mL | INTRAVENOUS | Status: DC | PRN
  Administered 2022-11-22: 3 mL via INTRAVENOUS

## 2022-11-22 MED ORDER — ORAL CARE MOUTH RINSE: 15.0000 mL | OROMUCOSAL | Status: DC | PRN

## 2022-11-22 MED ORDER — ACETAMINOPHEN 325 MG PO TABS: 650.0000 mg | ORAL_TABLET | ORAL | Status: DC | PRN

## 2022-11-22 MED ORDER — CHLORHEXIDINE GLUCONATE CLOTH 2 % EX PADS
6.0000 | MEDICATED_PAD | Freq: Every day | CUTANEOUS | Status: DC
  Administered 2022-11-23 (×2): 6 via TOPICAL

## 2022-11-22 MED ORDER — SACUBITRIL-VALSARTAN 24-26 MG PO TABS: 1.0000 | ORAL_TABLET | Freq: Two times a day (BID) | ORAL | Status: DC

## 2022-11-22 MED ORDER — ONDANSETRON HCL 4 MG/2ML IJ SOLN: 4.0000 mg | Freq: Four times a day (QID) | INTRAMUSCULAR | Status: DC | PRN

## 2022-11-22 MED ORDER — SODIUM CHLORIDE 0.9% FLUSH
3.0000 mL | Freq: Two times a day (BID) | INTRAVENOUS | Status: DC
  Administered 2022-11-22 – 2022-11-25 (×3): 3 mL via INTRAVENOUS

## 2022-11-22 MED ORDER — ASPIRIN 81 MG PO CHEW
324.0000 mg | CHEWABLE_TABLET | Freq: Once | ORAL | Status: DC
  Filled 2022-11-22: qty 4

## 2022-11-22 MED ORDER — SODIUM CHLORIDE 0.9 % IV SOLN: 250.0000 mL | INTRAVENOUS | Status: DC | PRN

## 2022-11-22 MED ORDER — ASPIRIN 81 MG PO TBEC
81.0000 mg | DELAYED_RELEASE_TABLET | Freq: Every evening | ORAL | Status: DC
  Administered 2022-11-22: 81 mg via ORAL
  Filled 2022-11-22: qty 1

## 2022-11-22 MED ORDER — HEPARIN SODIUM (PORCINE) 5000 UNIT/ML IJ SOLN
5000.0000 [IU] | Freq: Once | INTRAMUSCULAR | Status: AC
  Administered 2022-11-22: 5000 [IU] via SUBCUTANEOUS
  Filled 2022-11-22: qty 1

## 2022-11-22 NOTE — ED Provider Notes (Signed)
[place] EMERGENCY DEPARTMENT AT [facility] Provider Note   CSN: [id number] Arrival date & time: 11/22/22  1855     History  Chief Complaint  Patient presents with   Chest Pain   Shortness of Breath   Bradycardia    Grant Ochoa. is a 67 y.o. male.  HPI 67 year old male presents with chest tightness, dyspnea and fatigue. Symptoms started 2 days ago.  He states he feels like he has less energy, some lightheadedness, but has not felt like he is going to pass out.  He has on and off chest tightness and feeling like he cannot get a full breath.  Symptoms do not always correlate with the lightheadedness.  He has checked his heart rate on an Apple Watch and has been ranging between 40s and 80s.  He denies any recent changes to medications besides some Entresto dose change about a couple months ago.  Otherwise, he denies being ill such as having vomiting, diarrhea, fever, etc.  He has a history of a prior MI with systolic CHF.  He states that Dr. Eden Emms has talked him about potentially needing an ICD  Home Medications Prior to Admission medications   Medication Sig Start Date End Date Taking? Authorizing Provider  aspirin EC 81 MG tablet Take 81 mg by mouth every evening. Swallow whole.    [provider]  Evolocumab (REPATHA SURECLICK) 140 MG/ML SOAJ Inject 140 mg into the skin every 14 (fourteen) days. 08/31/22   Wendall Stade, MD  FARXIGA 10 MG TABS tablet Take 10 mg by mouth in the morning.    [provider]  loratadine-pseudoephedrine (CLARITIN-D 12-HOUR) 5-120 MG tablet Take 1 tablet by mouth daily as needed for allergies.    [provider]  metoprolol tartrate (LOPRESSOR) 25 MG tablet Take 25 mg by mouth every evening. 01/17/20   [provider]  naproxen sodium (ALEVE) 220 MG tablet Take 440 mg by mouth daily as needed (pain).    [provider]  Omega-3 Fatty Acids (FISH OIL) 1200 MG CAPS Take 1 capsule by mouth daily  at 6 (six) AM.    [provider]  sacubitril-valsartan (ENTRESTO) 24-26 MG Take 1 tablet by mouth 2 (two) times daily. 09/02/22   Gaston Islam., NP  spironolactone (ALDACTONE) 25 MG tablet Take 0.5 tablets (12.5 mg total) by mouth daily. 08/20/22   Gaston Islam., NP  tadalafil (CIALIS) 5 MG tablet Take 5 mg by mouth every evening. 04/19/21   [provider]      Allergies    Chantix [varenicline] and Lipitor [atorvastatin]    Review of Systems   Review of Systems  Constitutional:  Positive for fatigue.  Respiratory:  Positive for chest tightness and shortness of breath.   Gastrointestinal:  Negative for diarrhea and vomiting.  Neurological:  Positive for light-headedness.    Physical Exam Updated Vital Signs BP 134/65 (BP Location: Right Arm)   Pulse (!) 34   Temp 98.3 F (36.8 C) (Oral)   Resp 17   Ht  (1.778 m)   Wt 97.5 kg   SpO2 97%   BMI 30.85 kg/m  Physical Exam Vitals and nursing note reviewed.  Constitutional:      General: He is not in acute distress.    Appearance: He is well-developed. He is not ill-appearing or diaphoretic.  HENT:     Head: Normocephalic and atraumatic.  Cardiovascular:     Rate and  Rhythm: Regular rhythm. Bradycardia present.     Heart sounds: Normal heart sounds.  Pulmonary:     Effort: Pulmonary effort is normal.     Breath sounds: Normal breath sounds.  Abdominal:     Palpations: Abdomen is soft.     Tenderness: There is no abdominal tenderness.  Skin:    General: Skin is warm and dry.  Neurological:     Mental Status: He is alert.     ED Results / Procedures / Treatments   Labs (all labs ordered are listed, but only abnormal results are displayed) Labs Reviewed  BASIC METABOLIC PANEL - Abnormal; Notable for the following components:      Result Value   CO2 21 (*)    Glucose, Bld 160 (*)    Creatinine, Ser 1.41 (*)    GFR, Estimated 55 (*)    All other components within normal limits   TROPONIN I (HIGH SENSITIVITY) - Abnormal; Notable for the following components:   Troponin I (High Sensitivity) 54 (*)    All other components within normal limits  CBC  MAGNESIUM  TSH  TROPONIN I (HIGH SENSITIVITY)    EKG EKG Interpretation  Date/Time:  Monday November 22 2022 19:25:59 EDT Ventricular Rate:  37 PR Interval:    QRS Duration: 168 QT Interval:  576 QTC Calculation: 452 R Axis:   -75 Text Interpretation: Third degree heart block Left axis deviation Non-specific intra-ventricular conduction block Minimal voltage criteria for LVH, may be normal variant ( Cornell product ) Possible Lateral infarct , age undetermined Confirmed by Pricilla Loveless [phone]) on 11/22/2022 7:39:49 PM  Radiology DG Chest Portable 1 View  Result Date: 11/22/2022 CLINICAL DATA:  Chest pain, weakness, shortness of breath EXAM: PORTABLE CHEST 1 VIEW COMPARISON:  CT chest dated 07/30/2022 FINDINGS: Lungs are clear.  No pleural effusion or pneumothorax. The heart is top-normal in size. IMPRESSION: No acute cardiopulmonary disease. Electronically Signed   By: Charline Bills M.D.   On: 11/22/2022 20:18    Procedures .Critical Care  Performed by: Pricilla Loveless, MD Authorized by: Pricilla Loveless, MD   Critical care provider statement:    Critical care time (minutes):  30   Critical care time was exclusive of:  Separately billable procedures and treating other patients   Critical care was necessary to treat or prevent imminent or life-threatening deterioration of the following conditions:  Cardiac failure and circulatory failure   Critical care was time spent personally by me on the following activities:  Development of treatment plan with patient or surrogate, discussions with consultants, evaluation of patient's response to treatment, examination of patient, ordering and review of laboratory studies, ordering and review of radiographic studies, ordering and performing treatments and interventions,  pulse oximetry, re-evaluation of patient's condition and review of old charts     Medications Ordered in ED Medications - No data to display  ED Course/ Medical Decision Making/ A&P                             Medical Decision Making Amount and/or Complexity of Data Reviewed Labs: ordered.    Details: Mild AKI.  Troponin elevation likely secondary to strain from the bradycardia. Radiology: ordered and independent interpretation performed.    Details: No CHF ECG/medicine tests: ordered and independent interpretation performed.    Details: Complete heart block.  Risk Decision regarding hospitalization.   Patient presents with symptomatic bradycardia.  He is not hypotensive or in  distress as I do not think he needs pacing or temporary pacemaker.  I have discussed with cardiology, Dr. Okey Dupre, who will admit.  Has a mild bump in creatinine from baseline but not terribly significant that I think he would have a beta-blocker overdose.  Otherwise, I think his troponin is unlikely to be ACS and that his chest symptoms are more related to the bradycardia.  Will need to trend the troponin.  Will give some aspirin.  Otherwise, will need cardiology admission.        Final Clinical Impression(s) / ED Diagnoses Final diagnoses:  Complete heart block    Rx / DC Orders ED Discharge Orders     None         Pricilla Loveless, MD 11/22/22 2100

## 2022-11-22 NOTE — ED Triage Notes (Signed)
Pt began having weakness and SOB Saturday. Chest pain began today. Chest pain left sided and does not radiate. Described as dull ache. Pt has history of MI in 2010. If pt relaxes, pain gets better.

## 2022-11-22 NOTE — ED Notes (Signed)
Pacer pad applied. Cards at bedside.

## 2022-11-22 NOTE — ED Provider Triage Note (Cosign Needed Addendum)
Emergency Medicine Provider Triage Evaluation Note  Grant Ochoa. , a 67 y.o. male  was evaluated in triage.  Pt complains of left-sided chest pain that started 2 days ago with shortness of breath and episodes of lightheadedness, palpitations, dizziness, and near syncope.  Patient has a history of CAD with stent placed in 2010.  Notes his last heart catheterization was in January 2024 when he was found to have RCA and left circumflex that are fully occluded.  he denies abdominal pain, nausea, vomiting, diarrhea, fever, or chills.  He denies a history of bradycardia or arrhythmia.  At time of interview, patient states that he feels shaky, lightheaded, and like he might pass out.  Review of Systems  Positive: See HPI Negative: See HPI  Physical Exam  BP 134/65 (BP Location: Right Arm)   Pulse (!) 34   Temp 98.3 F (36.8 C) (Oral)   Resp 17   SpO2 97%  Gen:   Awake, mildly diaphoretic Resp:  Normal effort lungs clear to auscultation MSK:   Moves extremities without difficulty no lower extremity edema Other:  Bradycardia with irregular rhythm, abdomen soft and nontender, patient awake, alert and oriented, neurologically intact  Medical Decision Making  Medically screening exam initiated at 7:23 PM.  Appropriate orders placed.  Grant Lanae Boast. was informed that the remainder of the evaluation will be completed by another provider, this initial triage assessment does not replace that evaluation, and the importance of remaining in the ED until their evaluation is complete.  Nursing staff alerted pt needs bed immediately.    Tonette Lederer, PA-C 11/22/22 1925    Tonette Lederer, PA-C 11/22/22 1926

## 2022-11-22 NOTE — H&P (Signed)
Cardiology History & Physical    Patient ID: Grant Ochoa. MRN: [id number], DOB/AGE: Aug 26, 1955   Admit date: 11/22/2022  Primary Physician: Aviva Kluver Primary Cardiologist: Charlton Haws, MD  Patient Profile    Grant Ochoa. is a 67 year old male with a history of CAD s/p RCA PCI for NSTEMI in 2010 now with CTO of RCA and LCx (LAD has diffuse moderate disease), HFrEF, hypertension, and hyperlipidemia who presented with severe exertional intolerance.  History of Present Illness    Just traveled to [place] a week ago and had travel-related issues at the airport that resulted in having to walking quickly throughout the terminals, but was able to do so without any problem at the time. However, for the last few days he has been extremely fatigued with any exertion. No leg swelling or weight gain. No orthopnea. 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   He was found to have complete heart block on arrival here with escape rhythm around 40 bpm, but is hypertensive and asymptomatic at rest in bed.   Of note, he has had persistently decreased EF of around 30-35% and was being considered for ICD referral pending a CMR to confirm his EF. Of note, he does ride motorcycles frequently.   Past Medical History   Past Medical History:  Diagnosis Date   Arthritis    BPH (benign prostatic hyperplasia)    CAD (coronary artery disease)    Coronary artery disease    GAD (generalized anxiety disorder)    Hyperglycemia    Hypertension    Lightheadedness    Myocardial infarction 2010   stent   Syncope    Tremor     Past Surgical History:  Procedure Laterality Date   ARTERY AND TENDON REPAIR Left 07/15/2020   Procedure: LEFT INDEX , LONG,AND RING FINGER EXPLORATION ,REPAIR TENDON,ARTERY AND NERVE;  Surgeon: Betha Loa, MD;  Location: [facility] OR;  Service: Orthopedics;  Laterality: Left;   CORONARY ANGIOPLASTY  2010   LEFT HEART CATH AND CORONARY ANGIOGRAPHY N/A  08/11/2022   Procedure: LEFT HEART CATH AND CORONARY ANGIOGRAPHY;  Surgeon: Lyn Records, MD;  Location: [facility];  Service: Cardiovascular;  Laterality: N/A;   LIPOMA EXCISION  2012   forehead     Allergies Allergies  Allergen Reactions   Chantix [Varenicline] Nausea Only    Chantix Crazy Dreams   Lipitor [Atorvastatin] Nausea Only    GI upset    Home Medications    Prior to Admission medications   Medication Sig Start Date End Date Taking? Authorizing Provider  aspirin EC 81 MG tablet Take 81 mg by mouth every evening. Swallow whole.   Yes [provider]  Evolocumab (REPATHA SURECLICK) 140 MG/ML SOAJ Inject 140 mg into the skin every 14 (fourteen) days. 08/31/22  Yes Wendall Stade, MD  FARXIGA 10 MG TABS tablet Take 10 mg by mouth daily.   Yes [provider]  loratadine-pseudoephedrine (CLARITIN-D 12-HOUR) 5-120 MG tablet Take 1 tablet by mouth daily as needed for allergies.   Yes [provider]  metoprolol tartrate (LOPRESSOR) 25 MG tablet Take 25 mg by mouth every evening. 01/17/20  Yes [provider]  naproxen sodium (ALEVE) 220 MG tablet Take 440 mg by mouth daily as needed (pain).   Yes [provider]  sacubitril-valsartan (ENTRESTO) 24-26 MG Take 1 tablet by mouth 2 (two) times daily. 09/02/22  Yes Gaston Islam., NP  spironolactone (ALDACTONE) 25 MG tablet Take 0.5 tablets (12.5 mg total) by mouth daily. 08/20/22  Yes Gaston Islam., NP  tadalafil (CIALIS) 5 MG tablet Take 5 mg by mouth every evening. 04/19/21  Yes [provider]    Family History    Family History  Problem Relation Age of Onset   Hypertension Mother    Hypertension Father    Aortic aneurysm Father    He indicated that his mother is alive. He indicated that his father is deceased.   Social History    Social History   Socioeconomic History   Marital status: Married    Spouse name: Not on file   Number of children: Not  on file   Years of education: Not on file   Highest education level: Not on file  Occupational History   Not on file  Tobacco Use   Smoking status: Every Day    Packs/day: 1    Types: Cigarettes   Smokeless tobacco: Never  Vaping Use   Vaping Use: Some days  Substance and Sexual Activity   Alcohol use: Not Currently   Drug use: Never   Sexual activity: Not on file  Other Topics Concern   Not on file  Social History Narrative   Not on file   Social Determinants of Health   Financial Resource Strain: Not on file  Food Insecurity: Not on file  Transportation Needs: Not on file  Physical Activity: Not on file  Stress: Not on file  Social Connections: Not on file  Intimate Partner Violence: Not on file     Review of Systems    A comprehensive ROS was obtained with pertinent positives and negatives noted in the HPI.  Physical Exam    BP (!) 150/55   Pulse (!) 30   Temp 97.9 F (36.6 C) (Oral)   Resp (!) 23   Ht 5\' 10"  (1.778 m)   Wt 97.5 kg   SpO2 94%   BMI 30.85 kg/m  General: Alert, NAD HEENT: Normal  Neck: No bruits or JVD. Lungs:  Resp regular and unlabored, CTA bilaterally. Heart: Regular rhythm, no s3, s4, or murmurs. Abdomen: Soft, non-tender, non-distended, BS +.  Extremities: Warm. No clubbing, cyanosis or edema. DP/PT/Radials 2+ and equal bilaterally. Psych: Normal affect. Neuro: Alert and oriented. No gross focal deficits. No abnormal movements.  Labs    Troponin (Point of Care Test) No results for input(s): "TROPIPOC" in the last 72 hours. No results for input(s): "CKTOTAL", "CKMB", "TROPONINI" in the last 72 hours. Lab Results  Component Value Date   WBC 8.4 11/22/2022   HGB 15.5 11/22/2022   HCT 43.8 11/22/2022   MCV 88.3 11/22/2022   PLT 199 11/22/2022    Recent Labs  Lab 11/22/22 1931  NA 136  K 4.1  CL 103  CO2 21*  BUN 18  CREATININE 1.41*  CALCIUM 9.0  GLUCOSE 160*   No results found for: "CHOL", "HDL", "LDLCALC",  "TRIG" No results found for: "DDIMER"   Radiology Studies    DG Chest Portable 1 View  Result Date: 11/22/2022 CLINICAL DATA:  Chest pain, weakness, shortness of breath EXAM: PORTABLE CHEST 1 VIEW COMPARISON:  CT chest dated 07/30/2022 FINDINGS: Lungs are clear.  No pleural effusion or pneumothorax. The heart is top-normal in size. IMPRESSION: No acute cardiopulmonary disease. Electronically Signed   By: Charline Bills M.D.   On: 11/22/2022 20:18    ECG & Cardiac Imaging    ECG: sinus  rhythm with complete heart block and likely ventricular escape rhythm with intermittent PVCs - personally reviewed.  LHC/cors 08/11/2022: CONCLUSIONS: Total occlusion distal RCA proximal to the previously placed stent.  Poor left-to-right collaterals. Totally occluded mid circumflex after the first obtuse marginal. 40% distal left main. Diffuse proximal to mid 40 to 50% LAD narrowings.  As mentioned above LAD supplies collaterals to the distal RCA and also faint collaterals to the third obtuse marginal.   RECOMMENDATIONS:  Guideline directed therapy for ischemic systolic HF / cardiomyopathy. There do not appear to be reasonable revascularization options at this time given the widely patent LAD. Aggressive risk factor modification and preventive therapy.  TTE 07/16/2022:  1. Global hypokinesis worse in the inferoseptal, inferior and  inferolateral myocardium. Left ventricular ejection fraction, by  estimation, is 30 to 35%. The left ventricle has moderately decreased  function. The left ventricle demonstrates global  hypokinesis. Left ventricular diastolic parameters are consistent with  Grade I diastolic dysfunction (impaired relaxation). Elevated left  ventricular end-diastolic pressure.   2. Right ventricular systolic function is normal. The right ventricular  size is normal.   3. The mitral valve is normal in structure. Trivial mitral valve  regurgitation. No evidence of mitral stenosis. Moderate  mitral annular  calcification.   4. The aortic valve is tricuspid. There is mild calcification of the  aortic valve. There is mild thickening of the aortic valve. Aortic valve  regurgitation is not visualized. No aortic stenosis is present.   5. The inferior vena cava is normal in size with greater than 50%  respiratory variability, suggesting right atrial pressure of 3 mmHg.     Assessment & Plan    1. Symptomatic High Grade AV Block - likely due to degenerative conduction disease - he has known conduction disease and ischemia/infarction of the PDA territory. Suspect beta blocker contribution is minimal - this is a long-standing medication at relatively low dose. He is very symptomatic with exertion but stable and asymptomatic at rest.  - No indication at this point for temporary pacing but pacing pads to remain in place overnight. Monitor in ICU. - Hold metoprolol - EP to evaluate for permanent device in AM - he likely needs CRT-D. Has had low EF and being considered for ICD already.   2. Type 2 myocardial injury due to heart block in the setting of chronic CAD - chest discomfort only with exertion when he "gets angry", I do not think this is an acute coronary event and rather secondary to CHB with known obstructive CAD.  - Repeat troponin - Continue daily aspirin - No indication for ACS treatment at this point - He is on Repatha   3. Chronic HFrEF due to ICM - euvolemic and adequately perfused, NYHA IIIb but likely due to CHB rather than his CM. Has not been felt to have PCI targets and has EF 30-35% for which ICD is being considered.  - TTE as above - Holding GDMT due to heart block and AKI   4. Acute kidney injury: Cr 1.4 from 1, likely due to persistent severe bradycardia in the setting of heart block. - Hold Entresto and spironolactone  Nutrition: NPO DVT ZOX:[id number] SQ overnight, hold in AM Advanced Care Planning: Full Code   Signed, Clerance Lav, MD 11/22/2022, 10:08  PM

## 2022-11-23 ENCOUNTER — Inpatient Hospital Stay (HOSPITAL_COMMUNITY): Payer: BC Managed Care – PPO

## 2022-11-23 ENCOUNTER — Encounter (HOSPITAL_COMMUNITY): Payer: Self-pay | Admitting: Cardiovascular Disease

## 2022-11-23 ENCOUNTER — Encounter (HOSPITAL_COMMUNITY)
Admission: EM | Disposition: A | Discharge status: Home / Self Care | Payer: Self-pay | Source: Home / Self Care | Attending: Cardiovascular Disease

## 2022-11-23 DIAGNOSIS — I442 Atrioventricular block, complete: Secondary | ICD-10-CM

## 2022-11-23 DIAGNOSIS — I5042 Chronic combined systolic (congestive) and diastolic (congestive) heart failure: Secondary | ICD-10-CM | POA: Diagnosis not present

## 2022-11-23 DIAGNOSIS — I2510 Atherosclerotic heart disease of native coronary artery without angina pectoris: Secondary | ICD-10-CM | POA: Diagnosis not present

## 2022-11-23 HISTORY — PX: TEMPORARY PACEMAKER: CATH118268

## 2022-11-23 LAB — ECHOCARDIOGRAM COMPLETE
Area-P 1/2: 6.71 cm2
Height: 70 in
S' Lateral: 6.1 cm
Weight: 3440 oz

## 2022-11-23 SURGERY — TEMPORARY PACEMAKER
Anesthesia: LOCAL

## 2022-11-23 MED ORDER — MIDAZOLAM HCL 2 MG/2ML IJ SOLN
INTRAMUSCULAR | Status: AC
  Filled 2022-11-23: qty 2

## 2022-11-23 MED ORDER — LIDOCAINE HCL (PF) 1 % IJ SOLN
INTRAMUSCULAR | Status: AC
  Filled 2022-11-23: qty 30

## 2022-11-23 MED ORDER — MIDAZOLAM HCL 2 MG/2ML IJ SOLN
INTRAMUSCULAR | Status: DC | PRN
  Administered 2022-11-23: 1 mg via INTRAVENOUS

## 2022-11-23 MED ORDER — SODIUM CHLORIDE 0.9 % IV SOLN: INTRAVENOUS | Status: DC

## 2022-11-23 MED ORDER — CEFAZOLIN SODIUM-DEXTROSE 2-4 GM/100ML-% IV SOLN
2.0000 g | INTRAVENOUS | Status: AC
  Administered 2022-11-24: 2 g via INTRAVENOUS
  Filled 2022-11-23: qty 100

## 2022-11-23 MED ORDER — SODIUM CHLORIDE 0.9 % IV SOLN
80.0000 mg | INTRAVENOUS | Status: AC
  Filled 2022-11-23: qty 2

## 2022-11-23 MED ORDER — SODIUM CHLORIDE 0.9 % IV SOLN: 250.0000 mL | INTRAVENOUS | Status: DC

## 2022-11-23 MED ORDER — SODIUM CHLORIDE 0.9 % IV SOLN: INTRAVENOUS | Status: AC

## 2022-11-23 MED ORDER — FENTANYL CITRATE (PF) 100 MCG/2ML IJ SOLN
INTRAMUSCULAR | Status: AC
  Filled 2022-11-23: qty 2

## 2022-11-23 MED ORDER — LIDOCAINE HCL (PF) 1 % IJ SOLN
INTRAMUSCULAR | Status: DC | PRN
  Administered 2022-11-23: 2 mL

## 2022-11-23 MED ORDER — FENTANYL CITRATE (PF) 100 MCG/2ML IJ SOLN
INTRAMUSCULAR | Status: DC | PRN
  Administered 2022-11-23: 25 ug via INTRAVENOUS

## 2022-11-23 MED ORDER — CHLORHEXIDINE GLUCONATE 4 % EX SOLN: 60.0000 mL | Freq: Once | CUTANEOUS | Status: AC

## 2022-11-23 MED ORDER — PERFLUTREN LIPID MICROSPHERE
1.0000 mL | INTRAVENOUS | Status: AC | PRN
  Administered 2022-11-23: 6 mL via INTRAVENOUS

## 2022-11-23 MED ORDER — SODIUM CHLORIDE 0.9% FLUSH: 3.0000 mL | INTRAVENOUS | Status: DC | PRN

## 2022-11-23 MED ORDER — SODIUM CHLORIDE 0.9% FLUSH
3.0000 mL | Freq: Two times a day (BID) | INTRAVENOUS | Status: DC
  Administered 2022-11-23 – 2022-11-25 (×2): 3 mL via INTRAVENOUS

## 2022-11-23 MED ORDER — CHLORHEXIDINE GLUCONATE 4 % EX SOLN
60.0000 mL | Freq: Once | CUTANEOUS | Status: AC
  Administered 2022-11-23: 4 via TOPICAL
  Filled 2022-11-23: qty 15

## 2022-11-23 SURGICAL SUPPLY — 7 items
CATH S G BIP PACING (CATHETERS) IMPLANT
ELECT DEFIB PAD ADLT CADENCE (PAD) IMPLANT
KIT MICROPUNCTURE NIT STIFF (SHEATH) IMPLANT
SHEATH PINNACLE 6F 10CM (SHEATH) IMPLANT
SHEATH PROBE COVER 6X72 (BAG) IMPLANT
SLEEVE REPOSITIONING LENGTH 30 (MISCELLANEOUS) IMPLANT
WIRE PACING TEMP ST TIP 5 (CATHETERS) IMPLANT

## 2022-11-23 NOTE — H&P (View-Only) (Signed)
Rounding Note    Patient Name: Joncarlo Friberg. Date of Encounter: 11/23/2022  [facility] Cardiologist: Charlton Haws, MD    Subjective    67 yo with hx of CAD ,  chronic combined CHF - EF 30-35%. Presents with fatigue with exertion and was found to have heart block    Cath from Jan. 2024 shows  RCA :  occluded distally with poor left - right collaterals  LM:  40 % LAD : non obstructive disease LCx: occluded prox-mid with collateral filling   Echo from Dec. 2023 shows EF 30-35%   He remains in complete heart block  He denies any episodes of syncope or presyncope.  He denies any angina chest pain.    Inpatient Medications    Scheduled Meds:  aspirin EC  81 mg Oral QPM   Chlorhexidine Gluconate Cloth  6 each Topical Daily   sodium chloride flush  3 mL Intravenous Q12H   Continuous Infusions:  sodium chloride     sodium chloride     PRN Meds: sodium chloride, acetaminophen, ondansetron (ZOFRAN) IV, mouth rinse, sodium chloride flush   Vital Signs    Vitals:   11/23/22 0630 11/23/22 0645 11/23/22 0700 11/23/22 0800  BP: 122/77  (!) 89/78 116/72  Pulse: (!) 26 (!) 31 (!) 27 (!) 31  Resp: (!) Temp:      TempSrc:      SpO2: 96% 98% 96% 98%  Weight:      Height:        Intake/Output Summary (Last 24 hours) at 11/23/2022 0835 Last data filed at 11/23/2022 0400 Gross per 24 hour  Intake --  Output 525 ml  Net -525 ml      11/22/2022    7:30 PM 11/01/2022    2:51 PM 08/20/2022    3:13 PM  Last 3 Weights  Weight (lbs) 215 lb 225 lb 3.2 oz 226 lb 9.6 oz  Weight (kg) 97.523 kg 102.15 kg 102.785 kg      Telemetry    NSR with complete heart block - Personally Reviewed  ECG     - Personally Reviewed  Physical Exam   GEN: No acute distress.   Neck: No JVD Cardiac: irreg.   Respiratory: Clear to auscultation bilaterally. GI: Soft, nontender, non-distended  MS: No edema; No deformity. Neuro:  Nonfocal  Psych: Normal affect    Labs    High Sensitivity Troponin:   Recent Labs  Lab 11/22/22 1931 11/22/22 2250  TROPONINIHS 54* 52*     Chemistry Recent Labs  Lab 11/22/22 1931  NA 136  K 4.1  CL 103  CO2 21*  GLUCOSE 160*  BUN 18  CREATININE 1.41*  CALCIUM 9.0  MG 2.0  GFRNONAA 55*  ANIONGAP 12    Lipids No results for input(s): "CHOL", "TRIG", "HDL", "LABVLDL", "LDLCALC", "CHOLHDL" in the last 168 hours.  Hematology Recent Labs  Lab 11/22/22 1931  WBC 8.4  RBC 4.96  HGB 15.5  HCT 43.8  MCV 88.3  MCH 31.3  MCHC 35.4  RDW 13.4  PLT 199   Thyroid  Recent Labs  Lab 11/22/22 1931  TSH 2.304    BNPNo results for input(s): "BNP", "PROBNP" in the last 168 hours.  DDimer No results for input(s): "DDIMER" in the last 168 hours.   Radiology    DG Chest Portable 1 View  Result Date: 11/22/2022 CLINICAL DATA:  Chest pain, weakness, shortness of breath EXAM: PORTABLE  CHEST 1 VIEW COMPARISON:  CT chest dated 07/30/2022 FINDINGS: Lungs are clear.  No pleural effusion or pneumothorax. The heart is top-normal in size. IMPRESSION: No acute cardiopulmonary disease. Electronically Signed   By: Charline Bills M.D.   On: 11/22/2022 20:18    Cardiac Studies      Patient Profile     67 y.o. male    Assessment & Plan     1.  Complete heart block:   hx of severe LV dysfunction.  Will ask EP to see him to consideration of ICD   Will allow him to have some sips of water  Will start IV normal saline EP to see this am   2.  CAD :  has severe CAD , no angina   3.  Chronic combined CHF: meds include Farxiga, metoprolol 25 a day , Entresto 24-26 BID, spironolactone 25 mg a day      For questions or updates, please contact [facility] Please consult [email or web address] for contact info under        Signed, Kristeen Miss, MD  11/23/2022, 8:35 AM

## 2022-11-23 NOTE — Consult Note (Signed)
Cardiology Consultation   Patient ID: Grant Ochoa. MRN: [id number]; DOB: Jul 25, 1956  Admit date: 11/22/2022 Date of Consult: 11/23/2022  PCP:  Grant Ochoa   [facility] Prov[facility] Cardiologist:  Grant Haws, MD   {   Patient Profile:   Grant Ochoa. is a 67 y.o. male with a hx of HTN, HLD, CAD, ICM, chronic CHF, tremor who is being seen 11/23/2022 for the evaluation of CHB at the request of Dr. Elease Ochoa.  History of Present Illness:   Mr. Grant Ochoa recently saw Dr. Eden Ochoa 10/31/22, hx of some dizzy spells in the fall 2023, reported some CP +/- 2/2 his COPD Recent w/u included: 07/09/22 myoview showed old IMI estimated EF 29% Carotid with bilateral 40-59% ICA stenosis same day TTE 07/16/22 EF 30-35% trivial MR  Cath 08/11/22 showed CTO of RCA proximal to prior stent with weak left to right collaterals CTO mid LCX after OM 1 and diffuse 40-50% LAD Medical Rx thought best  Planned for c.MRI and possible EP referral pending EF/findings.  He cam last night/admitted for a few days of fairly acute onset fatigue, and marked reduction in exertional capacity. No reports of CP, SOB. He was found in CHB rat 30 or so, stable BP and minimal if at all symptomatic at rest. Admitted, his home lopressor (25mg  QHS) held.  Planned for EP eval.  LABS K+ 4.1 Mag 2.0 BUN/Creat 18/1.41 (last 1.01) HS Trop 54 > 52 WBC 8.4 H/H 15/43 Plts 199 TSH 2.304  Home meds reviewed Lopressor 25mg  QHS, no other nodal blocking agents noted  At rest he feels "totally fine", but in the last few days getting easily winded, feeling very week. Back in the fall when he 1st met Dr. Eden Ochoa he was having sudden episodes that were very strong felt loike he may faint and then gone just as suddenly.   He has not had this in months but since then, has had more a feeling of vaguely lightheaded for some time.  No syncope He had an occasional dull CP that is not new or escalating in any way He continues to  smoke  Past Medical History:  Diagnosis Date   Arthritis    BPH (benign prostatic hyperplasia)    CAD (coronary artery disease)    Coronary artery disease    GAD (generalized anxiety disorder)    Hyperglycemia    Hypertension    Lightheadedness    Myocardial infarction 2010   stent   Syncope    Tremor     Past Surgical History:  Procedure Laterality Date   ARTERY AND TENDON REPAIR Left 07/15/2020   Procedure: LEFT INDEX , LONG,AND RING FINGER EXPLORATION ,REPAIR TENDON,ARTERY AND NERVE;  Surgeon: Grant Loa, MD;  Location: [facility] OR;  Service: Orthopedics;  Laterality: Left;   CORONARY ANGIOPLASTY  2010   LEFT HEART CATH AND CORONARY ANGIOGRAPHY N/A 08/11/2022   Procedure: LEFT HEART CATH AND CORONARY ANGIOGRAPHY;  Surgeon: Grant Records, MD;  Location: [facility];  Service: Cardiovascular;  Laterality: N/A;   LIPOMA EXCISION  2012   forehead     Home Medications:  Prior to Admission medications   Medication Sig Start Date End Date Taking? Authorizing Provider  aspirin EC 81 MG tablet Take 81 mg by mouth every evening. Swallow whole.   Yes [provider]  Evolocumab (REPATHA SURECLICK) 140 MG/ML SOAJ Inject 140 mg into the skin every 14 (fourteen) days. 08/31/22  Yes Grant Stade, MD  FARXIGA 10 MG TABS tablet Take 10 mg by mouth daily.   Yes [provider]  loratadine-pseudoephedrine (CLARITIN-D 12-HOUR) 5-120 MG tablet Take 1 tablet by mouth daily as needed for allergies.   Yes [provider]  metoprolol tartrate (LOPRESSOR) 25 MG tablet Take 25 mg by mouth every evening. 01/17/20  Yes [provider]  naproxen sodium (ALEVE) 220 MG tablet Take 440 mg by mouth daily as needed (pain).   Yes [provider]  sacubitril-valsartan (ENTRESTO) 24-26 MG Take 1 tablet by mouth 2 (two) times daily. 09/02/22  Yes Grant Ochoa., NP  spironolactone (ALDACTONE) 25 MG tablet Take 0.5 tablets (12.5 mg total) by mouth daily. 08/20/22   Yes Grant Ochoa., NP  tadalafil (CIALIS) 5 MG tablet Take 5 mg by mouth every evening. 04/19/21  Yes [provider]    Inpatient Medications: Scheduled Meds:  aspirin EC  81 mg Oral QPM   Chlorhexidine Gluconate Cloth  6 each Topical Daily   sodium chloride flush  3 mL Intravenous Q12H   Continuous Infusions:  sodium chloride     sodium chloride 150 mL/hr at 11/23/22 0900   PRN Meds: sodium chloride, acetaminophen, ondansetron (ZOFRAN) IV, mouth rinse, sodium chloride flush  Allergies:    Allergies  Allergen Reactions   Chantix [Varenicline] Nausea Only    Chantix Crazy Dreams   Lipitor [Atorvastatin] Nausea Only    GI upset    Social History:   Social History   Socioeconomic History   Marital status: Married    Spouse name: Not on file   Number of children: Not on file   Years of education: Not on file   Highest education level: Not on file  Occupational History   Not on file  Tobacco Use   Smoking status: Every Day    Packs/day: 1    Types: Cigarettes   Smokeless tobacco: Never  Vaping Use   Vaping Use: Some days  Substance and Sexual Activity   Alcohol use: Not Currently   Drug use: Never   Sexual activity: Not on file  Other Topics Concern   Not on file  Social History Narrative   Not on file   Social Determinants of Health   Financial Resource Strain: Not on file  Food Insecurity: Not on file  Transportation Needs: Not on file  Physical Activity: Not on file  Stress: Not on file  Social Connections: Not on file  Intimate Partner Violence: Not on file    Family History:    Family History  Problem Relation Age of Onset   Hypertension Mother    Hypertension Father    Aortic aneurysm Father      ROS:  Please see the history of present illness.   All other ROS reviewed and negative.     Physical Exam/Data:   Vitals:   11/23/22 0630 11/23/22 0645 11/23/22 0700 11/23/22 0800  BP: 122/77  (!) 89/78 116/72  Pulse: (!) 26  (!) 31 (!) 27 (!) 31  Resp: (!) Temp:      TempSrc:      SpO2: 96% 98% 96% 98%  Weight:      Height:        Intake/Output Summary (Last 24 hours) at 11/23/2022 0912 Last data filed at 11/23/2022 0400 Gross per 24 hour  Intake --  Output 525 ml  Net -525 ml      11/22/2022    7:30 PM 11/01/2022  2:51 PM 08/20/2022    3:13 PM  Last 3 Weights  Weight (lbs) 215 lb 225 lb 3.2 oz 226 lb 9.6 oz  Weight (kg) 97.523 kg 102.15 kg 102.785 kg     Body mass index is 30.85 kg/m.  General:  Well nourished, well developed, in no acute distress HEENT: normal Neck: no JVD Vascular: No carotid bruits Cardiac:  RRR; no murmurs, gallops or rubs Lungs:  clear to auscultation bilaterally, no wheezing, rhonchi or rales  Abd: soft, nontender, no hepatomegaly  Ext: no edema Musculoskeletal:  No deformities, BUE and BLE strength normal and equal Skin: warm and dry  Neuro:  no focal abnormalities noted Psych:  Normal affect   EKG:  The EKG was personally reviewed and demonstrates:   CHB 37bpm, , LAD, LBBB , diffuse T changes  OLD 08/11/22: SR 68bpm, RBBB, LAD  Telemetry:  Telemetry was personally reviewed and demonstrates:   CHB 29-30 or so  Relevant CV Studies:  08/11/22: [facility] CONCLUSIONS: Total occlusion distal RCA proximal to the previously placed stent.  Poor left-to-right collaterals. Totally occluded mid circumflex after the first obtuse marginal. 40% distal left main. Diffuse proximal to mid 40 to 50% LAD narrowings.  As mentioned above LAD supplies collaterals to the distal RCA and also faint collaterals to the third obtuse marginal.   RECOMMENDATIONS: Guideline directed therapy for ischemic systolic HF / cardiomyopathy. There do not appear to be reasonable revascularization options at this time given the widely patent LAD. Aggressive risk factor modification and preventive therapy.   07/16/22: TTE 1. Global hypokinesis worse in the inferoseptal, inferior and   inferolateral myocardium. Left ventricular ejection fraction, by  estimation, is 30 to 35%. The left ventricle has moderately decreased  function. The left ventricle demonstrates global  hypokinesis. Left ventricular diastolic parameters are consistent with  Grade I diastolic dysfunction (impaired relaxation). Elevated left  ventricular end-diastolic pressure.   2. Right ventricular systolic function is normal. The right ventricular  size is normal.   3. The mitral valve is normal in structure. Trivial mitral valve  regurgitation. No evidence of mitral stenosis. Moderate mitral annular  calcification.   4. The aortic valve is tricuspid. There is mild calcification of the  aortic valve. There is mild thickening of the aortic valve. Aortic valve  regurgitation is not visualized. No aortic stenosis is present.   5. The inferior vena cava is normal in size with greater than 50%  respiratory variability, suggesting right atrial pressure of 3 mmHg.    07/09/22: myoview  Findings are consistent with prior myocardial infarction in the RCA territory and no ischemia. The study is high risk due to reduced systolic function.   No ST deviation was noted.   Left ventricular function is abnormal. There were multiple regional abnormalities. Nuclear stress EF: 29 %. The left ventricular ejection fraction is severely decreased (<30%). End systolic cavity size is moderately enlarged.   Prior study not available for comparison.   Laboratory Data:  High Sensitivity Troponin:   Recent Labs  Lab 11/22/22 1931 11/22/22 2250  TROPONINIHS 54* 52*     Chemistry Recent Labs  Lab 11/22/22 1931  NA 136  K 4.1  CL 103  CO2 21*  GLUCOSE 160*  BUN 18  CREATININE 1.41*  CALCIUM 9.0  MG 2.0  GFRNONAA 55*  ANIONGAP 12    No results for input(s): "PROT", "ALBUMIN", "AST", "ALT", "ALKPHOS", "BILITOT" in the last 168 hours. Lipids No results for input(s): "CHOL", "TRIG", "HDL", "LABVLDL", "  LDLCALC",  "CHOLHDL" in the last 168 hours.  Hematology Recent Labs  Lab 11/22/22 1931  WBC 8.4  RBC 4.96  HGB 15.5  HCT 43.8  MCV 88.3  MCH 31.3  MCHC 35.4  RDW 13.4  PLT 199   Thyroid  Recent Labs  Lab 11/22/22 1931  TSH 2.304    BNPNo results for input(s): "BNP", "PROBNP" in the last 168 hours.  DDimer No results for input(s): "DDIMER" in the last 168 hours.   Radiology/Studies:  DG Chest Portable 1 View  Result Date: 11/22/2022 CLINICAL DATA:  Chest pain, weakness, shortness of breath EXAM: PORTABLE CHEST 1 VIEW COMPARISON:  CT chest dated 07/30/2022 FINDINGS: Lungs are clear.  No pleural effusion or pneumothorax. The heart is top-normal in size. IMPRESSION: No acute cardiopulmonary disease. Electronically Signed   By: Charline Bills M.D.   On: 11/22/2022 20:18     Assessment and Plan:   CHB Low dose lopressor, not likely to be contributing significantly if at all He needs CRT-D  Will plan a temp wire for now to give better hemodynamic support and hopefully settle down his PVCs and pursue permanent implant as schedule allows  CAD No anginal symptoms Low flat HS Trop Medical management by cath in jan  ICM Chronic CHF LVEF by myoview 29% and TTE 30-35% No other historical cardiac data available Appears compensated currently by exam,/CXR Home meds held w/CHB  HTN Home meds held   6.  AKI 2/2 BB follow   Risk Assessment/Risk Scores:     For questions or updates, please contact [facility] Please consult [email or web address] for contact info under    Signed, Sheilah Pigeon, PA-C  11/23/2022 9:12 AM;

## 2022-11-23 NOTE — Progress Notes (Signed)
 Rounding Note    Patient Name: Grant Edward Stenerson Jr. Date of Encounter: 11/23/2022  [facility] Cardiologist: Peter Nishan, MD    Subjective    66 yo with hx of CAD ,  chronic combined CHF - EF 30-35%. Presents with fatigue with exertion and was found to have heart block    Cath from Jan. 2024 shows  RCA :  occluded distally with poor left - right collaterals  LM:  40 % LAD : non obstructive disease LCx: occluded prox-mid with collateral filling   Echo from Dec. 2023 shows EF 30-35%   He remains in complete heart block  He denies any episodes of syncope or presyncope.  He denies any angina chest pain.    Inpatient Medications    Scheduled Meds:  aspirin EC  81 mg Oral QPM   Chlorhexidine Gluconate Cloth  6 each Topical Daily   sodium chloride flush  3 mL Intravenous Q12H   Continuous Infusions:  sodium chloride     sodium chloride     PRN Meds: sodium chloride, acetaminophen, ondansetron (ZOFRAN) IV, mouth rinse, sodium chloride flush   Vital Signs    Vitals:   11/23/22 0630 11/23/22 0645 11/23/22 0700 11/23/22 0800  BP: 122/77  (!) 89/78 116/72  Pulse: (!) 26 (!) 31 (!) 27 (!) 31  Resp: (!) 22 17 19 18  Temp:      TempSrc:      SpO2: 96% 98% 96% 98%  Weight:      Height:        Intake/Output Summary (Last 24 hours) at 11/23/2022 0835 Last data filed at 11/23/2022 0400 Gross per 24 hour  Intake --  Output 525 ml  Net -525 ml      11/22/2022    7:30 PM 11/01/2022    2:51 PM 08/20/2022    3:13 PM  Last 3 Weights  Weight (lbs) 215 lb 225 lb 3.2 oz 226 lb 9.6 oz  Weight (kg) 97.523 kg 102.15 kg 102.785 kg      Telemetry    NSR with complete heart block - Personally Reviewed  ECG     - Personally Reviewed  Physical Exam   GEN: No acute distress.   Neck: No JVD Cardiac: irreg.   Respiratory: Clear to auscultation bilaterally. GI: Soft, nontender, non-distended  MS: No edema; No deformity. Neuro:  Nonfocal  Psych: Normal affect    Labs    High Sensitivity Troponin:   Recent Labs  Lab 11/22/22 1931 11/22/22 2250  TROPONINIHS 54* 52*     Chemistry Recent Labs  Lab 11/22/22 1931  NA 136  K 4.1  CL 103  CO2 21*  GLUCOSE 160*  BUN 18  CREATININE 1.41*  CALCIUM 9.0  MG 2.0  GFRNONAA 55*  ANIONGAP 12    Lipids No results for input(s): "CHOL", "TRIG", "HDL", "LABVLDL", "LDLCALC", "CHOLHDL" in the last 168 hours.  Hematology Recent Labs  Lab 11/22/22 1931  WBC 8.4  RBC 4.96  HGB 15.5  HCT 43.8  MCV 88.3  MCH 31.3  MCHC 35.4  RDW 13.4  PLT 199   Thyroid  Recent Labs  Lab 11/22/22 1931  TSH 2.304    BNPNo results for input(s): "BNP", "PROBNP" in the last 168 hours.  DDimer No results for input(s): "DDIMER" in the last 168 hours.   Radiology    DG Chest Portable 1 View  Result Date: 11/22/2022 CLINICAL DATA:  Chest pain, weakness, shortness of breath EXAM: PORTABLE   CHEST 1 VIEW COMPARISON:  CT chest dated 07/30/2022 FINDINGS: Lungs are clear.  No pleural effusion or pneumothorax. The heart is top-normal in size. IMPRESSION: No acute cardiopulmonary disease. Electronically Signed   By: Charline Bills M.D.   On: 11/22/2022 20:18    Cardiac Studies      Patient Profile     67 y.o. male    Assessment & Plan     1.  Complete heart block:   hx of severe LV dysfunction.  Will ask EP to see him to consideration of ICD   Will allow him to have some sips of water  Will start IV normal saline EP to see this am   2.  CAD :  has severe CAD , no angina   3.  Chronic combined CHF: meds include Farxiga, metoprolol 25 a day , Entresto 24-26 BID, spironolactone 25 mg a day      For questions or updates, please contact [facility] Please consult [email or web address] for contact info under        Signed, Kristeen Miss, MD  11/23/2022, 8:35 AM

## 2022-11-23 NOTE — Interval H&P Note (Signed)
History and Physical Interval Note:  11/23/2022 11:09 AM  Grant Ochoa.  has presented today for surgery, with the diagnosis of heart block.  The various methods of treatment have been discussed with the patient and family. After consideration of risks, benefits and other options for treatment, the patient has consented to  Procedure(s): TEMPORARY PACEMAKER (N/A) as a surgical intervention.  The patient's history has been reviewed, patient examined, no change in status, stable for surgery.  I have reviewed the patient's chart and labs.  Questions were answered to the patient's satisfaction.    EP team has seen patient. Recommends temporary transvenous pacing wire. All questions answered.   Tonny Bollman

## 2022-11-24 ENCOUNTER — Encounter (HOSPITAL_COMMUNITY)
Admission: EM | Disposition: A | Discharge status: Home / Self Care | Payer: Self-pay | Source: Home / Self Care | Attending: Cardiovascular Disease

## 2022-11-24 ENCOUNTER — Other Ambulatory Visit: Payer: Self-pay

## 2022-11-24 DIAGNOSIS — I509 Heart failure, unspecified: Secondary | ICD-10-CM | POA: Diagnosis not present

## 2022-11-24 DIAGNOSIS — I442 Atrioventricular block, complete: Secondary | ICD-10-CM | POA: Diagnosis not present

## 2022-11-24 HISTORY — PX: BIV ICD INSERTION CRT-D: EP1195

## 2022-11-24 LAB — BASIC METABOLIC PANEL
Anion gap: 12 (ref 5–15)
BUN: 14 mg/dL (ref 8–23)
CO2: 19 mmol/L — ABNORMAL LOW (ref 22–32)
Calcium: 8.4 mg/dL — ABNORMAL LOW (ref 8.9–10.3)
Chloride: 105 mmol/L (ref 98–111)
Creatinine, Ser: 1.09 mg/dL (ref 0.61–1.24)
GFR, Estimated: >60 mL/min (ref >60)
Glucose, Bld: 136 mg/dL — ABNORMAL HIGH (ref 70–99)
Potassium: 4.1 mmol/L (ref 3.5–5.1)
Sodium: 136 mmol/L (ref 135–145)

## 2022-11-24 LAB — CBC
HCT: 46.4 % (ref 39.0–52.0)
Hemoglobin: 16.4 g/dL (ref 13.0–17.0)
MCH: 30.9 pg (ref 26.0–34.0)
MCHC: 35.3 g/dL (ref 30.0–36.0)
MCV: 87.4 fL (ref 80.0–100.0)
Platelets: 200 10*3/uL (ref 150–400)
RBC: 5.31 MIL/uL (ref 4.22–5.81)
RDW: 13 % (ref 11.5–15.5)
WBC: 7.3 10*3/uL (ref 4.0–10.5)
nRBC: 0 % (ref 0.0–0.2)

## 2022-11-24 SURGERY — BIV ICD INSERTION CRT-D
Anesthesia: LOCAL

## 2022-11-24 MED ORDER — HEPARIN (PORCINE) IN NACL 1000-0.9 UT/500ML-% IV SOLN
INTRAVENOUS | Status: DC | PRN
  Administered 2022-11-24: 500 mL

## 2022-11-24 MED ORDER — SACUBITRIL-VALSARTAN 24-26 MG PO TABS
1.0000 | ORAL_TABLET | Freq: Two times a day (BID) | ORAL | Status: DC
  Administered 2022-11-24 – 2022-11-25 (×2): 1 via ORAL
  Filled 2022-11-24 (×2): qty 1

## 2022-11-24 MED ORDER — CEFAZOLIN SODIUM-DEXTROSE 1-4 GM/50ML-% IV SOLN
1.0000 g | Freq: Four times a day (QID) | INTRAVENOUS | Status: AC
  Administered 2022-11-24 – 2022-11-25 (×3): 1 g via INTRAVENOUS
  Filled 2022-11-24 (×4): qty 50

## 2022-11-24 MED ORDER — FENTANYL CITRATE (PF) 100 MCG/2ML IJ SOLN
INTRAMUSCULAR | Status: AC
  Filled 2022-11-24: qty 2

## 2022-11-24 MED ORDER — DAPAGLIFLOZIN PROPANEDIOL 10 MG PO TABS
10.0000 mg | ORAL_TABLET | Freq: Every day | ORAL | Status: DC
  Administered 2022-11-25: 10 mg via ORAL
  Filled 2022-11-24 (×2): qty 1

## 2022-11-24 MED ORDER — SODIUM CHLORIDE 0.9 % IV SOLN
INTRAVENOUS | Status: AC
  Administered 2022-11-24: 80 mg
  Filled 2022-11-24: qty 2

## 2022-11-24 MED ORDER — METOPROLOL TARTRATE 25 MG PO TABS
25.0000 mg | ORAL_TABLET | Freq: Every evening | ORAL | Status: DC
  Administered 2022-11-24: 25 mg via ORAL
  Filled 2022-11-24: qty 1

## 2022-11-24 MED ORDER — LIDOCAINE HCL (PF) 1 % IJ SOLN
INTRAMUSCULAR | Status: DC | PRN
  Administered 2022-11-24: 60 mL

## 2022-11-24 MED ORDER — SPIRONOLACTONE 12.5 MG HALF TABLET
12.5000 mg | ORAL_TABLET | Freq: Every day | ORAL | Status: DC
  Administered 2022-11-24 – 2022-11-25 (×2): 12.5 mg via ORAL
  Filled 2022-11-24 (×2): qty 1

## 2022-11-24 MED ORDER — MIDAZOLAM HCL 5 MG/5ML IJ SOLN
INTRAMUSCULAR | Status: AC
  Filled 2022-11-24: qty 5

## 2022-11-24 MED ORDER — IOHEXOL 350 MG/ML SOLN
INTRAVENOUS | Status: DC | PRN
  Administered 2022-11-24: 12 mL

## 2022-11-24 MED ORDER — MIDAZOLAM HCL 5 MG/5ML IJ SOLN
INTRAMUSCULAR | Status: DC | PRN
  Administered 2022-11-24 (×2): 1 mg via INTRAVENOUS

## 2022-11-24 MED ORDER — FENTANYL CITRATE (PF) 100 MCG/2ML IJ SOLN
INTRAMUSCULAR | Status: DC | PRN
  Administered 2022-11-24 (×2): 25 ug via INTRAVENOUS

## 2022-11-24 MED ORDER — NICOTINE 14 MG/24HR TD PT24
14.0000 mg | MEDICATED_PATCH | Freq: Every day | TRANSDERMAL | Status: DC
  Filled 2022-11-24 (×2): qty 1

## 2022-11-24 MED ORDER — CEFAZOLIN SODIUM-DEXTROSE 2-4 GM/100ML-% IV SOLN
INTRAVENOUS | Status: AC
  Filled 2022-11-24: qty 100

## 2022-11-24 SURGICAL SUPPLY — 16 items
BALLN COR SINUS VENO 6FR 80 (BALLOONS) ×1
BALLOON COR SINUS VENO 6FR 80 (BALLOONS) IMPLANT
CABLE SURGICAL S-101-97-12 (CABLE) ×1 IMPLANT
CATH CPS DIRECT 135 DS2C020 (CATHETERS) IMPLANT
ICD GALLANT HFCRTD CDHFA500Q (ICD Generator) IMPLANT
LEAD DURATA 7122Q-65CM (Lead) IMPLANT
LEAD QUARTET 1458Q-86CM (Lead) IMPLANT
LEAD ULTIPACE 52 LPA1231/52 (Lead) IMPLANT
PAD DEFIB RADIO PHYSIO CONN (PAD) ×1 IMPLANT
SHEATH 7FR PRELUDE SNAP 13 (SHEATH) IMPLANT
SHEATH 8FR PRELUDE SNAP 13 (SHEATH) IMPLANT
SHEATH 9.5FR PRELUDE SNAP 13 (SHEATH) IMPLANT
SLITTER AGILIS HISPRO (INSTRUMENTS) IMPLANT
TRAY PACEMAKER INSERTION (PACKS) ×1 IMPLANT
WIRE ACUITY WHISPER EDS 4648 (WIRE) IMPLANT
WIRE HI TORQ VERSACORE-J 145CM (WIRE) IMPLANT

## 2022-11-24 NOTE — Progress Notes (Addendum)
Notified by nurse BP uptrending, last 172/98. Previously meds held on admission due to CHB with hypotension. Patient is now s/p PPM. Cr improved this AM. D/w Dr. Royann Shivers. Will resume home BP med regimen. Pt also requested nicotine patch, will order.

## 2022-11-24 NOTE — Progress Notes (Signed)
Rounding Note    Patient Name: Grant Ochoa. Date of Encounter: 11/24/2022  [facility] Cardiologist: Charlton Haws, MD   Subjective   Slept poorly,felt a bit hot/cold, no fever, no symptoms otherwise,  anxious to eat and get out of bed  Inpatient Medications    Scheduled Meds:  aspirin EC  81 mg Oral QPM   Chlorhexidine Gluconate Cloth  6 each Topical Daily   gentamicin (GARAMYCIN) 80 mg in sodium chloride 0.9 % 500 mL irrigation  80 mg Irrigation On Call   sodium chloride flush  3 mL Intravenous Q12H   sodium chloride flush  3 mL Intravenous Q12H   Continuous Infusions:  sodium chloride     sodium chloride 50 mL/hr at 11/24/22 0700   sodium chloride      ceFAZolin (ANCEF) IV     PRN Meds: sodium chloride, acetaminophen, ondansetron (ZOFRAN) IV, mouth rinse, sodium chloride flush, sodium chloride flush   Vital Signs    Vitals:   11/24/22 0400 11/24/22 0500 11/24/22 0600 11/24/22 0700  BP: (!) 134/100 (!) 161/94 (!) 140/90 (!) 152/97  Pulse: 69 70 69 70  Resp: (!) 24 19 (!) 30 19  Temp:      TempSrc:      SpO2: 95% 95% 97% 95%  Weight:      Height:        Intake/Output Summary (Last 24 hours) at 11/24/2022 0729 Last data filed at 11/24/2022 0700 Gross per 24 hour  Intake 289.38 ml  Output 1250 ml  Net -960.62 ml      11/22/2022    7:30 PM 11/01/2022    2:51 PM 08/20/2022    3:13 PM  Last 3 Weights  Weight (lbs) 215 lb 225 lb 3.2 oz 226 lb 9.6 oz  Weight (kg) 97.523 kg 102.15 kg 102.785 kg      Telemetry    V paced at 70, occ PVCs - Personally Reviewed  ECG    No new EKGs - Personally Reviewed  Physical Exam   GEN: No acute distress.   Neck: No JVD, R IJ site is stable Cardiac: RRR, no murmurs, rubs, or gallops.  Respiratory: Clear to auscultation bilaterally. GI: Soft, nontender, non-distended  MS: No edema; No deformity. Neuro:  Nonfocal  Psych: Normal affect   Labs    High Sensitivity Troponin:   Recent Labs  Lab  11/22/22 1931 11/22/22 2250  TROPONINIHS 54* 52*     Chemistry Recent Labs  Lab 11/22/22 1931  NA 136  K 4.1  CL 103  CO2 21*  GLUCOSE 160*  BUN 18  CREATININE 1.41*  CALCIUM 9.0  MG 2.0  GFRNONAA 55*  ANIONGAP 12    Lipids No results for input(s): "CHOL", "TRIG", "HDL", "LABVLDL", "LDLCALC", "CHOLHDL" in the last 168 hours.  Hematology Recent Labs  Lab 11/22/22 1931  WBC 8.4  RBC 4.96  HGB 15.5  HCT 43.8  MCV 88.3  MCH 31.3  MCHC 35.4  RDW 13.4  PLT 199   Thyroid  Recent Labs  Lab 11/22/22 1931  TSH 2.304    BNPNo results for input(s): "BNP", "PROBNP" in the last 168 hours.  DDimer No results for input(s): "DDIMER" in the last 168 hours.   Radiology      Cardiac Studies    11/23/22: TTE 1. Left ventricular ejection fraction, by estimation, is 30 to 35%. The  left ventricle has moderately decreased function. The left ventricle  demonstrates regional wall motion abnormalities (  see scoring  diagram/findings for description). The left  ventricular internal cavity size was severely dilated. Left ventricular  diastolic parameters are indeterminate.   2. Right ventricular systolic function is normal. The right ventricular  size is mildly enlarged.   3. Left atrial size was mildly dilated.   4. The mitral valve is abnormal. Trivial mitral valve regurgitation.  Moderate mitral annular calcification.   5. The aortic valve is tricuspid. Aortic valve regurgitation is not  visualized. No aortic stenosis is present.   Comparison(s): LV further dilated from prior.    08/11/22: [facility] CONCLUSIONS: Total occlusion distal RCA proximal to the previously placed stent.  Poor left-to-right collaterals. Totally occluded mid circumflex after the first obtuse marginal. 40% distal left main. Diffuse proximal to mid 40 to 50% LAD narrowings.  As mentioned above LAD supplies collaterals to the distal RCA and also faint collaterals to the third obtuse  marginal. RECOMMENDATIONS: Guideline directed therapy for ischemic systolic HF / cardiomyopathy. There do not appear to be reasonable revascularization options at this time given the widely patent LAD. Aggressive risk factor modification and preventive therapy.     07/16/22: TTE 1. Global hypokinesis worse in the inferoseptal, inferior and  inferolateral myocardium. Left ventricular ejection fraction, by  estimation, is 30 to 35%. The left ventricle has moderately decreased  function. The left ventricle demonstrates global  hypokinesis. Left ventricular diastolic parameters are consistent with  Grade I diastolic dysfunction (impaired relaxation). Elevated left  ventricular end-diastolic pressure.   2. Right ventricular systolic function is normal. The right ventricular  size is normal.   3. The mitral valve is normal in structure. Trivial mitral valve  regurgitation. No evidence of mitral stenosis. Moderate mitral annular  calcification.   4. The aortic valve is tricuspid. There is mild calcification of the  aortic valve. There is mild thickening of the aortic valve. Aortic valve  regurgitation is not visualized. No aortic stenosis is present.   5. The inferior vena cava is normal in size with greater than 50%  respiratory variability, suggesting right atrial pressure of 3 mmHg.      07/09/22: myoview  Findings are consistent with prior myocardial infarction in the RCA territory and no ischemia. The study is high risk due to reduced systolic function.   No ST deviation was noted.   Left ventricular function is abnormal. There were multiple regional abnormalities. Nuclear stress EF: 29 %. The left ventricular ejection fraction is severely decreased (<30%). End systolic cavity size is moderately enlarged.   Prior study not available for comparison.    Patient Profile     67 y.o. male HTN, HLD, CAD, ICM, chronic CHF, tremor admitted with CHB  He saw Dr. Eden Emms 10/31/22, with hx of  some dizzy spells back in the fall 2023, reported some CP +/- 2/2 his COPD Recent w/u included: 07/09/22 myoview showed old IMI estimated EF 29% Carotid with bilateral 40-59% ICA stenosis same day TTE 07/16/22 EF 30-35% trivial MR  Cath 08/11/22 showed CTO of RCA proximal to prior stent with weak left to right collaterals CTO mid LCX after OM 1 and diffuse 40-50% LAD Medical Rx thought best Planned for c.MRI and possible EP referral pending EF/findings.   He was admitted 4/15 for a few days of fairly acute onset fatigue, and marked reduction in exertional capacity. No reports of CP, SOB. He was found in CHB rat 30 or so, stable BP and minimal if at all symptomatic at rest. Admitted, his home lopressor (25mg   QHS) held.  Planned for EP eval. Temp wire placed yesterday with a rise in PVC burden to bigeminy and rates dipping towards 29  Assessment & Plan     CHB Low dose home lopressor, not felt to be contributing significantly if at all, none since admission Temp wire threshold this AM is <1, left at 70bpm/102mA  Planned for CRT-D today Dr. Nelly Laurence has seen the patient this morning discussed procedure, pt is agreeable to proceed NPO Orders are in   CAD No anginal symptoms Low flat HS Trop Medical management by cath in jan   ICM Chronic CHF LVEF by myoview 29% and TTE 30-35% Dec 2023 Remains 30-35% by echo yesterday, + WMA, cath Jan 2024 with no revascularization options for occ RCA and OM  No other historical cardiac data available Appears compensated currently by exam,/CXR Home meds held w/CHB    HTN Home meds held     6.  AKI 2/2 BB Labs this AM pending   For questions or updates, please contact [facility] Please consult [email or web address] for contact info under        Signed, Sheilah Pigeon, PA-C  11/24/2022, 7:29 AM

## 2022-11-25 ENCOUNTER — Other Ambulatory Visit (HOSPITAL_COMMUNITY): Payer: Self-pay

## 2022-11-25 ENCOUNTER — Encounter (HOSPITAL_COMMUNITY): Payer: Self-pay | Admitting: Cardiovascular Disease

## 2022-11-25 ENCOUNTER — Inpatient Hospital Stay (HOSPITAL_COMMUNITY): Payer: BC Managed Care – PPO

## 2022-11-25 DIAGNOSIS — I5A Non-ischemic myocardial injury (non-traumatic): Secondary | ICD-10-CM | POA: Diagnosis not present

## 2022-11-25 DIAGNOSIS — I442 Atrioventricular block, complete: Secondary | ICD-10-CM | POA: Diagnosis not present

## 2022-11-25 DIAGNOSIS — N179 Acute kidney failure, unspecified: Secondary | ICD-10-CM | POA: Diagnosis not present

## 2022-11-25 DIAGNOSIS — I5042 Chronic combined systolic (congestive) and diastolic (congestive) heart failure: Secondary | ICD-10-CM | POA: Diagnosis not present

## 2022-11-25 LAB — BASIC METABOLIC PANEL
Anion gap: 13 (ref 5–15)
BUN: 12 mg/dL (ref 8–23)
CO2: 22 mmol/L (ref 22–32)
Calcium: 8.5 mg/dL — ABNORMAL LOW (ref 8.9–10.3)
Chloride: 101 mmol/L (ref 98–111)
Creatinine, Ser: 1.03 mg/dL (ref 0.61–1.24)
GFR, Estimated: >60 mL/min (ref >60)
Glucose, Bld: 135 mg/dL — ABNORMAL HIGH (ref 70–99)
Potassium: 3.7 mmol/L (ref 3.5–5.1)
Sodium: 136 mmol/L (ref 135–145)

## 2022-11-25 MED ORDER — CARVEDILOL 12.5 MG PO TABS
12.5000 mg | ORAL_TABLET | Freq: Two times a day (BID) | ORAL | 5 refills | Status: DC
  Filled 2022-11-25: qty 60, 30d supply, fill #0

## 2022-11-25 MED ORDER — CARVEDILOL 12.5 MG PO TABS
12.5000 mg | ORAL_TABLET | Freq: Two times a day (BID) | ORAL | Status: DC
  Administered 2022-11-25: 12.5 mg via ORAL
  Filled 2022-11-25 (×2): qty 1

## 2022-11-25 NOTE — Progress Notes (Signed)
Pt taken via wc to private vehicle accompanied by wife.  No s/s of any acute distress.  No c/o pain.  Verbalized understanding of all instructions.

## 2022-11-25 NOTE — Discharge Instructions (Signed)
     Supplemental Discharge Instructions for  Pacemaker/Defibrillator Patients   Activity No heavy lifting or vigorous activity with your left/right arm for 6 to 8 weeks.  Do not raise your left/right arm above your head for one week.  Gradually raise your affected arm as drawn below.             11/29/22                    11/30/22                    12/01/22                  12/02/22 __  NO DRIVING for   1 week  ; you may begin driving on   1/61/09  .  WOUND CARE Keep the wound area clean and dry.  Do not get this area wet , no showers for one week; you may shower on  12/02/22   . The tape/steri-strips on your wound will fall off; do not pull them off.  No bandage is needed on the site.  DO  NOT apply any creams, oils, or ointments to the wound area. If you notice any drainage or discharge from the wound, any swelling or bruising at the site, or you develop a fever > 101? F after you are discharged home, call the office at once.  Special Instructions You are still able to use cellular telephones; use the ear opposite the side where you have your pacemaker/defibrillator.  Avoid carrying your cellular phone near your device. When traveling through airports, show security personnel your identification card to avoid being screened in the metal detectors.  Ask the security personnel to use the hand wand. Avoid arc welding equipment, MRI testing (magnetic resonance imaging), TENS units (transcutaneous nerve stimulators).  Call the office for questions about other devices. Avoid electrical appliances that are in poor condition or are not properly grounded. Microwave ovens are safe to be near or to operate.  Additional information for defibrillator patients should your device go off: If your device goes off ONCE and you feel fine afterward, notify the device clinic nurses. If your device goes off ONCE and you do not feel well afterward, call 911. If your device goes off TWICE, call 911. If your  device goes off THREE times in one day, call 911.  DO NOT DRIVE YOURSELF OR A FAMILY MEMBER WITH A DEFIBRILLATOR TO THE HOSPITAL--CALL 911.

## 2022-11-25 NOTE — Plan of Care (Signed)
Pt alert and oriented.  On RA.  No issues with pacemaker.  Tolerating diet.  Tolerating activity.  Ambulates without problem.

## 2022-11-25 NOTE — Discharge Summary (Signed)
ELECTROPHYSIOLOGY PROCEDURE DISCHARGE SUMMARY    Patient ID: Grant Ochoa.,  MRN: [id number], DOB/AGE: 1955/08/12 67 y.o.  Admit date: 11/22/2022 Discharge date: 11/25/2022  Primary Care Physician: Pcp, No  Primary Cardiologist: Dr. Eden Emms Electrophysiologist: Dr. Nelly Laurence  Primary Discharge Diagnosis:  CHB  Secondary Discharge Diagnosis:  CAD ICM Chronic CHF (systolic) Compensated    Allergies  Allergen Reactions   Chantix [Varenicline] Nausea Only    Chantix Crazy Dreams   Lipitor [Atorvastatin] Nausea Only    GI upset     Procedures This Admission:  1.  Implantation of a Abbott CRT-D on 11/24/22 by Dr Nelly Laurence.   DFT's were deferred at time of implant.   There were no immediate post procedure complications. 2.  CXR on 11/25/22 demonstrated no pneumothorax status post device implantation.   Brief HPI: Grant Ochoa. is a 67 y.o. male was admitted with c/o a few days of fairly acute onset fatigue, and marked reduction in exertional capacity.  No reports of CP, SOB. He was found in CHB rat 30 or so  Hospital Course:  The patient was admitted home metoprolol held, BP stable.  With development of increasing burden of PVCs, temp wire placed.  TTE done noted persistent LV dysfunction 30-35%,  with primary prevention ICD indication, no recovery of conduction and suspect high if not 100% VP needs planned for CRT-D.  He underwent implant  with details as outlined in his procedure report. Left IJ site is stable, left chest was without hematoma or ecchymosis.  The device was interrogated and found to be functioning normally.  CXR was obtained and demonstrated no pneumothorax status post device implantation.  Wound care, arm mobility, and restrictions were reviewed with the patient.  The patient feels well, denies any CP or SOB, minimal site discomfort, he was examined by Dr. Nelly Laurence and considered stable for discharge to home.   The patient's discharge medications  include an ACE/ARB (Entresto) and beta blocker (metoprolol >> carvedilol).   Physical Exam: Vitals:   11/25/22 0400 11/25/22 0535 11/25/22 0600 11/25/22 0725  BP:  (!) 167/89 (!) 126/102 129/85  Pulse: 68 68 74 80  Resp: (!) 29 15 (!) 28 20  Temp:    98 F (36.7 C)  TempSrc:      SpO2: 91% 93% 92% 92%  Weight:      Height:        GEN- The patient is well appearing, alert and oriented x 3 today.   HEENT: normocephalic, atraumatic; sclera clear, conjunctiva pink; hearing intact; oropharynx clear L IJ site is stable, no bleeding or hematoma Lungs-  CTA b/l, normal work of breathing.  No wheezes, rales, rhonchi Heart-  RRR, no murmurs, rubs or gallops, PMI not laterally displaced GI- soft, non-tender, non-distended Extremities- no clubbing, cyanosis, or edema MS- no significant deformity or atrophy Skin- warm and dry, no rash or lesion, left chest without hematoma/ecchymosis Psych- euthymic mood, full affect Neuro- no gross defecits  Labs:   Lab Results  Component Value Date   WBC 7.3 11/24/2022   HGB 16.4 11/24/2022   HCT 46.4 11/24/2022   MCV 87.4 11/24/2022   PLT 200 11/24/2022    Recent Labs  Lab 11/25/22 0435  NA 136  K 3.7  CL 101  CO2 22  BUN 12  CREATININE 1.03  CALCIUM 8.5*  GLUCOSE 135*    Discharge Medications:  Allergies as of 11/25/2022       Reactions  Chantix [varenicline] Nausea Only   Chantix Crazy Dreams   Lipitor [atorvastatin] Nausea Only   GI upset        Medication List     STOP taking these medications    metoprolol tartrate 25 MG tablet Commonly known as: LOPRESSOR       TAKE these medications    aspirin EC 81 MG tablet Take 81 mg by mouth every evening. Swallow whole.   carvedilol 12.5 MG tablet Commonly known as: COREG Take 1 tablet (12.5 mg total) by mouth 2 (two) times daily with a meal.   Entresto 24-26 MG Generic drug: sacubitril-valsartan Take 1 tablet by mouth 2 (two) times daily.   Farxiga 10 MG Tabs  tablet Generic drug: dapagliflozin propanediol Take 10 mg by mouth daily.   loratadine-pseudoephedrine 5-120 MG tablet Commonly known as: CLARITIN-D 12-hour Take 1 tablet by mouth daily as needed for allergies.   naproxen sodium 220 MG tablet Commonly known as: ALEVE Take 440 mg by mouth daily as needed (pain).   Repatha SureClick 140 MG/ML Soaj Generic drug: Evolocumab Inject 140 mg into the skin every 14 (fourteen) days.   spironolactone 25 MG tablet Commonly known as: ALDACTONE Take 0.5 tablets (12.5 mg total) by mouth daily.   tadalafil 5 MG tablet Commonly known as: CIALIS Take 5 mg by mouth every evening.        Disposition: home Discharge Instructions     Diet - low sodium heart healthy   Complete by: As directed    Increase activity slowly   Complete by: As directed         Duration of Discharge Encounter: Greater than 30 minutes including physician time.  Norma Fredrickson, PA-C 11/25/2022 10:03 AM

## 2022-12-06 ENCOUNTER — Ambulatory Visit (HOSPITAL_COMMUNITY): Payer: BC Managed Care – PPO

## 2022-12-08 NOTE — Patient Instructions (Signed)

## 2022-12-09 ENCOUNTER — Ambulatory Visit: Payer: BC Managed Care – PPO | Attending: Cardiovascular Disease

## 2022-12-09 DIAGNOSIS — I442 Atrioventricular block, complete: Secondary | ICD-10-CM | POA: Diagnosis not present

## 2022-12-21 ENCOUNTER — Encounter: Payer: Self-pay | Admitting: Pharmacist

## 2022-12-21 MED ORDER — CARVEDILOL 12.5 MG PO TABS: 12.5000 mg | ORAL_TABLET | Freq: Two times a day (BID) | ORAL | 1 refills | Status: DC

## 2022-12-29 LAB — CUP PACEART INCLINIC DEVICE CHECK
Date Time Interrogation Session: 20240502133914
Implantable Lead Connection Status: 753985
Implantable Lead Connection Status: 753985
Implantable Lead Connection Status: 753985
Implantable Lead Implant Date: 20240417
Implantable Lead Implant Date: 20240417
Implantable Lead Implant Date: 20240417
Implantable Lead Location: 753858
Implantable Lead Location: 753859
Implantable Lead Location: 753860
Implantable Pulse Generator Implant Date: 20240417
Pulse Gen Serial Number: 111060096

## 2022-12-29 NOTE — Progress Notes (Signed)
Error in saving report. See scanned media for details.   Wound check appointment. Steri-strips removed. Wound without redness or edema. Incision edges approximated, wound well healed. Normal device function. Thresholds, sensing, and impedances consistent with implant measurements. Device programmed at 3.5V for extra safety margin until 3 month visit. Histogram distribution appropriate for patient and level of activity. No mode switches or ventricular arrhythmias noted. Patient educated about wound care, arm mobility, lifting restrictions, shock plan. ROV in 3 months with implanting physician.

## 2023-01-13 ENCOUNTER — Other Ambulatory Visit: Payer: Self-pay | Admitting: Nurse Practitioner

## 2023-02-08 NOTE — Progress Notes (Signed)
CARDIOLOGY CONSULT NOTE       Patient ID: Grant Ochoa. MRN: [id number] DOB/AGE: August 07, 1956 67 y.o.  Admit date: (Not on file) Referring Physician: Watt Climes Primary Physician: Pcp, No Primary Cardiologist: Eden Emms    HPI:  67 y.o. 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 DAT scan negative   PMH indicates history of CAD with stent in 2010 Seen by Dr Judithe Modest cardiology [facility] in 2021 His note indicated NSTEMI 2010 with stenting of RCA   He is a smoker with over 69 packyear history Lung cancer CT negative 08/04/22.    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   Myovue done 07/09/22 showed old IMI estimated EF 29% Carotid with bilateral 40-59% ICA stenosis same day TTE 07/16/22 EF 30-35% trivial MR  Cath 08/11/22 showed CTO of RCA proximal to prior stent with weak left to right collaterals CTO mid LCX after OM 1 and diffuse 40-50% LAD Medical Rx thought best  Admitted to hospital with pre syncope and complete heart block that did not go away off beta blocker Abbott CRT-D placed by Dr Nelly Laurence on 11/23/22    ROS All other systems reviewed and negative except as noted above  Past Medical History:  Diagnosis Date   Arthritis    BPH (benign prostatic hyperplasia)    CAD  (coronary artery disease)    Coronary artery disease    GAD (generalized anxiety disorder)    Hyperglycemia    Hypertension    Lightheadedness    Myocardial infarction (HCC) 2010   stent   Syncope    Tremor     Family History  Problem Relation Age of Onset   Hypertension Mother    Hypertension Father    Aortic aneurysm Father     Social History   Socioeconomic History   Marital status: Married    Spouse name: Not on file   Number of children: Not on file   Years of education: Not on file   Highest education level: Not on file  Occupational History   Not on file  Tobacco Use   Smoking status: Every Day    Current packs/day: 1.00    Types: Cigarettes   Smokeless tobacco: Never  Vaping Use   Vaping status: Some Days  Substance and Sexual Activity   Alcohol use: Not Currently   Drug use: Never   Sexual activity: Not on file  Other Topics Concern   Not on file  Social History Narrative   Not on file   Social Determinants of Health   Financial Resource Strain: Not on file  Food Insecurity: No Food Insecurity (11/24/2022)   Hunger Vital Sign    Worried About Running Out of Food  in the Last Year: Never true    Ran Out of Food in the Last Year: Never true  Transportation Needs: No Transportation Needs (11/24/2022)   PRAPARE - Administrator, Civil Service (Medical): No    Lack of Transportation (Non-Medical): No  Physical Activity: Not on file  Stress: Not on file  Social Connections: Unknown (12/20/2021)   Received from [facility]   Social Network    Social Network: Not on file  Intimate Partner Violence: Not At Risk (11/24/2022)   Humiliation, Afraid, Rape, and Kick questionnaire    Fear of Current or Ex-Partner: No    Emotionally Abused: No    Physically Abused: No    Sexually Abused: No    Past Surgical History:  Procedure Laterality Date   ARTERY AND TENDON REPAIR Left 07/15/2020   Procedure: LEFT INDEX , LONG,AND RING FINGER EXPLORATION  ,REPAIR TENDON,ARTERY AND NERVE;  Surgeon: Betha Loa, MD;  Location: [facility] OR;  Service: Orthopedics;  Laterality: Left;   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   Procedure: BIV ICD INSERTION CRT-D;  Surgeon: Mealor, Roberts Gaudy, MD;  Location: [facility] INVASIVE CV LAB;  Service: Cardiovascular;  Laterality: N/A;   CORONARY ANGIOPLASTY  2010   LEFT HEART CATH AND CORONARY ANGIOGRAPHY N/A 08/11/2022   Procedure: LEFT HEART CATH AND CORONARY ANGIOGRAPHY;  Surgeon: Lyn Records, MD;  Location: [facility] INVASIVE CV LAB;  Service: Cardiovascular;  Laterality: N/A;   LIPOMA EXCISION  2012   forehead   TEMPORARY PACEMAKER N/A 11/23/2022   Procedure: TEMPORARY PACEMAKER;  Surgeon: Tonny Bollman, MD;  Location: [facility] INVASIVE CV LAB;  Service: Cardiovascular;  Laterality: N/A;      Current Outpatient Medications:    aspirin EC 81 MG tablet, Take 81 mg by mouth every evening. Swallow whole., Disp: , Rfl:    carvedilol (COREG) 12.5 MG tablet, Take 1 tablet (12.5 mg total) by mouth 2 (two) times daily with a meal., Disp: 180 tablet, Rfl: 1   Evolocumab (REPATHA SURECLICK) 140 MG/ML SOAJ, Inject 140 mg into the skin every 14 (fourteen) days., Disp: 2 mL, Rfl: 11   FARXIGA 10 MG TABS tablet, Take 10 mg by mouth daily., Disp: , Rfl:    loratadine-pseudoephedrine (CLARITIN-D 12-HOUR) 5-120 MG tablet, Take 1 tablet by mouth daily as needed for allergies., Disp: , Rfl:    naproxen sodium (ALEVE) 220 MG tablet, Take 440 mg by mouth daily as needed (pain)., Disp: , Rfl:    sacubitril-valsartan (ENTRESTO) 24-26 MG, Take 1 tablet by mouth twice daily, Disp: 60 tablet, Rfl: 1   spironolactone (ALDACTONE) 25 MG tablet, Take 0.5 tablets (12.5 mg total) by mouth daily., Disp: 90 tablet, Rfl: 1   tadalafil (CIALIS) 5 MG tablet, Take 5 mg by mouth every evening., Disp: , Rfl:   Current Facility-Administered Medications:    sodium chloride flush (NS) 0.9 % injection 3 mL, 3 mL, Intravenous, Q12H, Nieve Rojero, Noralyn Pick, MD    Physical  Exam: Blood pressure (!) 130/90, pulse 61, height 5\' 10"  (1.778 m), weight 218 lb 9.6 oz (99.2 kg), SpO2 95%.    Affect appropriate Chronically ill male HEENT: normal Neck supple with no adenopathy JVP normal bilateral bruits no thyromegaly Lungs clear with no wheezing and good diaphragmatic motion Heart:  S1/S2 SEM murmur, no rub, gallop or click PMI enlarged AICD under left clavicle  Abdomen: benighn, BS positve, no tenderness, no AAA no bruit.  No HSM or HJR Distal pulses palpable  No edema Neuro  non-focal Skin warm and dry No muscular weakness  Labs:   Lab Results  Component Value Date   WBC 7.3 11/24/2022   HGB 16.4 11/24/2022   HCT 46.4 11/24/2022   MCV 87.4 11/24/2022   PLT 200 11/24/2022   No results for input(s): "NA", "K", "CL", "CO2", "BUN", "CREATININE", "CALCIUM", "PROT", "BILITOT", "ALKPHOS", "ALT", "AST", "GLUCOSE" in the last 168 hours.  Invalid input(s): "LABALBU" No results found for: "CKTOTAL", "CKMB", "CKMBINDEX", "TROPONINI" No results found for: "CHOL" No results found for: "HDL" No results found for: "LDLCALC" No results found for: "TRIG" No results found for: "CHOLHDL" No results found for: "LDLDIRECT"    Radiology: No results found.  EKG: SR rate 72 LAD RBBB PR 212 msec ? Old IMI    ASSESSMENT AND PLAN:   CAD:  distant stent to RCA in 2010 occluded RCA/LCX with moderate LAD dx by cath 08/11/22  HLD:  now on Repatha lipitor caused nausea and GI upset Needs updated labs   Smoking: counseled on cessation < 10 minutes Lung cancer CT negative 07/15/22  CRT-D with CHB and PVC burden Abbott CRT-D placed 11/24/22 F/U with Dr Nelly Laurence Normal function  HTN:  continue current meds Bruit:  bilateral 40-59% ICA stenosis f/u duplex 07/2023 Ischemic DCM:  EF 30-35% by TTE 11/23/22 GDMT with coreg, farxiga, entresto and aldactone    Carotid duplex 07/2023  Lipid/Liver  Echo post CRT October 2024  F/U in 3 months   Signed: Charlton Haws 02/18/2023, 9:12  AM

## 2023-02-18 ENCOUNTER — Ambulatory Visit: Payer: BC Managed Care – PPO | Attending: Internal Medicine

## 2023-02-18 ENCOUNTER — Encounter: Payer: Self-pay | Admitting: Cardiovascular Disease

## 2023-02-18 ENCOUNTER — Ambulatory Visit: Payer: BC Managed Care – PPO | Attending: Cardiovascular Disease | Admitting: Cardiovascular Disease

## 2023-02-18 VITALS — BP 130/90 | HR 61 | Ht 70.0 in | Wt 218.6 lb

## 2023-02-18 DIAGNOSIS — I5020 Unspecified systolic (congestive) heart failure: Secondary | ICD-10-CM | POA: Diagnosis not present

## 2023-02-18 DIAGNOSIS — R0989 Other specified symptoms and signs involving the circulatory and respiratory systems: Secondary | ICD-10-CM

## 2023-02-18 DIAGNOSIS — E782 Mixed hyperlipidemia: Secondary | ICD-10-CM | POA: Diagnosis not present

## 2023-02-18 DIAGNOSIS — I420 Dilated cardiomyopathy: Secondary | ICD-10-CM

## 2023-02-18 DIAGNOSIS — I442 Atrioventricular block, complete: Secondary | ICD-10-CM

## 2023-02-18 DIAGNOSIS — I255 Ischemic cardiomyopathy: Secondary | ICD-10-CM | POA: Diagnosis not present

## 2023-02-18 LAB — BASIC METABOLIC PANEL
BUN/Creatinine Ratio: 12 (ref 10–24)
BUN: 13 mg/dL (ref 8–27)
CO2: 19 mmol/L — ABNORMAL LOW (ref 20–29)
Calcium: 9.2 mg/dL (ref 8.6–10.2)
Chloride: 99 mmol/L (ref 96–106)
Creatinine, Ser: 1.1 mg/dL (ref 0.76–1.27)
Glucose: 105 mg/dL — ABNORMAL HIGH (ref 70–99)
Potassium: 4.6 mmol/L (ref 3.5–5.2)
Sodium: 137 mmol/L (ref 134–144)
eGFR: 74 mL/min/{1.73_m2} (ref >59)

## 2023-02-18 LAB — HEPATIC FUNCTION PANEL
ALT: 15 IU/L (ref 0–44)
AST: 21 IU/L (ref 0–40)
Albumin: 4.2 g/dL (ref 3.9–4.9)
Alkaline Phosphatase: 79 IU/L (ref 44–121)
Bilirubin Total: 0.7 mg/dL (ref 0.0–1.2)
Bilirubin, Direct: 0.13 mg/dL (ref 0.00–0.40)
Total Protein: 6.7 g/dL (ref 6.0–8.5)

## 2023-02-18 LAB — LIPID PANEL
Chol/HDL Ratio: 4.6 ratio (ref 0.0–5.0)
Cholesterol, Total: 184 mg/dL (ref 100–199)
HDL: 40 mg/dL (ref >39)
LDL Chol Calc (NIH): 107 mg/dL — ABNORMAL HIGH (ref 0–99)
Triglycerides: 215 mg/dL — ABNORMAL HIGH (ref 0–149)
VLDL Cholesterol Cal: 37 mg/dL (ref 5–40)

## 2023-02-18 NOTE — Patient Instructions (Addendum)
Medication Instructions:  Your physician recommends that you continue on your current medications as directed. Please refer to the Current Medication list given to you today.  *If you need a refill on your cardiac medications before your next appointment, please call your pharmacy*  Lab Work: Your physician recommends that you have lab work today-  fasting lipid and liver panel.  If you have labs (blood work) drawn today and your tests are completely normal, you will receive your results only by: MyChart Message (if you have MyChart) OR A paper copy in the mail If you have any lab test that is abnormal or we need to change your treatment, we will call you to review the results.  Testing/Procedures: Your physician has requested that you have an echocardiogram. Echocardiography is a painless test that uses sound waves to create images of your heart. It provides your doctor with information about the size and shape of your heart and how well your heart's chambers and valves are working. This procedure takes approximately one hour. There are no restrictions for this procedure. Please do NOT wear cologne, perfume, aftershave, or lotions (deodorant is allowed). Please arrive 15 minutes prior to your appointment time.  Your physician has requested that you have a carotid duplex. This test is an ultrasound of the carotid arteries in your neck. It looks at blood flow through these arteries that supply the brain with blood. Allow one hour for this exam. There are no restrictions or special instructions.  Follow-Up: At [facility], you and your health needs are our priority.  As part of our continuing mission to provide you with exceptional heart care, we have created designated Provider Care Teams.  These Care Teams include your primary Cardiologist (physician) and Advanced Practice Providers (APPs -  Physician Assistants and Nurse Practitioners) who all work together to provide you with the  care you need, when you need it.  We recommend signing up for the patient portal called "MyChart".  Sign up information is provided on this After Visit Summary.  MyChart is used to connect with patients for Virtual Visits (Telemedicine).  Patients are able to view lab/test results, encounter notes, upcoming appointments, etc.  Non-urgent messages can be sent to your provider as well.   To learn more about what you can do with MyChart, go to [email or web address].    Your next appointment:   3 month(s)  Provider:   Charlton Haws, MD

## 2023-02-24 ENCOUNTER — Ambulatory Visit (INDEPENDENT_AMBULATORY_CARE_PROVIDER_SITE_OTHER): Payer: BC Managed Care – PPO

## 2023-02-24 DIAGNOSIS — I255 Ischemic cardiomyopathy: Secondary | ICD-10-CM

## 2023-02-24 LAB — CUP PACEART REMOTE DEVICE CHECK
Battery Remaining Longevity: 95 mo
Battery Remaining Percentage: 91 %
Battery Voltage: 3.01 V
Brady Statistic AP VP Percent: 15 %
Brady Statistic AP VS Percent: 1 %
Brady Statistic AS VP Percent: 78 %
Brady Statistic AS VS Percent: 2.8 %
Brady Statistic RA Percent Paced: 10 %
Date Time Interrogation Session: 20240718020908
HighPow Impedance: 75 Ohm
Implantable Lead Connection Status: 753985
Implantable Lead Connection Status: 753985
Implantable Lead Connection Status: 753985
Implantable Lead Implant Date: 20240417
Implantable Lead Implant Date: 20240417
Implantable Lead Implant Date: 20240417
Implantable Lead Location: 753858
Implantable Lead Location: 753859
Implantable Lead Location: 753860
Implantable Pulse Generator Implant Date: 20240417
Lead Channel Impedance Value: 360 Ohm
Lead Channel Impedance Value: 630 Ohm
Lead Channel Impedance Value: 830 Ohm
Lead Channel Pacing Threshold Amplitude: 0.5 V
Lead Channel Pacing Threshold Amplitude: 0.75 V
Lead Channel Pacing Threshold Amplitude: 1 V
Lead Channel Pacing Threshold Pulse Width: 0.5 ms
Lead Channel Pacing Threshold Pulse Width: 0.5 ms
Lead Channel Pacing Threshold Pulse Width: 0.5 ms
Lead Channel Sensing Intrinsic Amplitude: 11.9 mV
Lead Channel Sensing Intrinsic Amplitude: 2.4 mV
Lead Channel Setting Pacing Amplitude: 1.5 V
Lead Channel Setting Pacing Amplitude: 1.5 V
Lead Channel Setting Pacing Amplitude: 1.75 V
Lead Channel Setting Pacing Pulse Width: 0.5 ms
Lead Channel Setting Pacing Pulse Width: 0.5 ms
Lead Channel Setting Sensing Sensitivity: 0.5 mV
Pulse Gen Serial Number: 111060096

## 2023-02-28 ENCOUNTER — Ambulatory Visit: Payer: BC Managed Care – PPO | Attending: Cardiovascular Disease | Admitting: Cardiovascular Disease

## 2023-02-28 ENCOUNTER — Encounter: Payer: Self-pay | Admitting: Cardiovascular Disease

## 2023-02-28 VITALS — BP 120/74 | HR 63 | Ht 70.0 in | Wt 218.0 lb

## 2023-02-28 DIAGNOSIS — I5020 Unspecified systolic (congestive) heart failure: Secondary | ICD-10-CM | POA: Diagnosis not present

## 2023-02-28 LAB — CUP PACEART INCLINIC DEVICE CHECK
Battery Remaining Longevity: 94 mo
Brady Statistic RA Percent Paced: 9.8 %
Brady Statistic RV Percent Paced: 92 %
Date Time Interrogation Session: 20240722144723
HighPow Impedance: 70.875
Implantable Lead Connection Status: 753985
Implantable Lead Connection Status: 753985
Implantable Lead Connection Status: 753985
Implantable Lead Implant Date: 20240417
Implantable Lead Implant Date: 20240417
Implantable Lead Implant Date: 20240417
Implantable Lead Location: 753858
Implantable Lead Location: 753859
Implantable Lead Location: 753860
Implantable Pulse Generator Implant Date: 20240417
Lead Channel Impedance Value: 337.5 Ohm
Lead Channel Impedance Value: 625 Ohm
Lead Channel Impedance Value: 812.5 Ohm
Lead Channel Pacing Threshold Amplitude: 0.5 V
Lead Channel Pacing Threshold Amplitude: 0.5 V
Lead Channel Pacing Threshold Amplitude: 0.75 V
Lead Channel Pacing Threshold Amplitude: 0.75 V
Lead Channel Pacing Threshold Amplitude: 1 V
Lead Channel Pacing Threshold Amplitude: 1 V
Lead Channel Pacing Threshold Pulse Width: 0.5 ms
Lead Channel Pacing Threshold Pulse Width: 0.5 ms
Lead Channel Pacing Threshold Pulse Width: 0.5 ms
Lead Channel Pacing Threshold Pulse Width: 0.5 ms
Lead Channel Pacing Threshold Pulse Width: 0.5 ms
Lead Channel Pacing Threshold Pulse Width: 0.5 ms
Lead Channel Sensing Intrinsic Amplitude: 11.9 mV
Lead Channel Sensing Intrinsic Amplitude: 2.1 mV
Lead Channel Setting Pacing Amplitude: 1.5 V
Lead Channel Setting Pacing Amplitude: 1.5 V
Lead Channel Setting Pacing Amplitude: 1.625
Lead Channel Setting Pacing Pulse Width: 0.5 ms
Lead Channel Setting Pacing Pulse Width: 0.5 ms
Lead Channel Setting Sensing Sensitivity: 0.5 mV
Pulse Gen Serial Number: 111060096

## 2023-02-28 NOTE — Patient Instructions (Signed)

## 2023-02-28 NOTE — Progress Notes (Signed)
  Electrophysiology Office Note:    Date:  02/28/2023   ID:  Grant Ochoa., DOB [birth year], MRN [id number]  PCP:  Grant Ochoa   [facility] Providers Cardiologist:  Grant Haws, MD Electrophysiologist:  Grant Small, MD     Referring MD: No ref. provider found   History of Present Illness:    Grant Staton Markey. is a 67 y.o. male with a medical history significant for CAD s/p PCI or RCA 2010, who presents for device follow-up.     S/p placement of a CRT-D device after he presented in April 2024 with complete heart block and a history of CHFrEF, 30-35% due to ischemic cardiomyopathy.      he has no device related complaints -- no new tenderness, drainage, redness.   EKGs/Labs/Other Studies Reviewed Today:    Echocardiogram:  TTE 11/23/2022 EF 30-35%;    Monitors:   Stress testing:   Advanced imaging:   Cardiac catherization    EKG:   EKG Interpretation Date/Time:  Monday February 28 2023 13:51:07 EDT Ventricular Rate:  63 PR Interval:  176 QRS Duration:  180 QT Interval:  470 QTC Calculation: 480 R Axis:   246  Text Interpretation: Atrial-sensed ventricular-paced rhythm Biventricular pacemaker detected When compared with ECG of 25-Nov-2022 06:55, Vent. rate has decreased BY  10 BPM Confirmed by York Pellant [phone]) on 02/28/2023 2:31:00 PM     Physical Exam:    VS:  BP 120/74   Pulse 63   Ht 5\' 10"  (1.778 m)   Wt 218 lb (98.9 kg)   SpO2 97%   BMI 31.28 kg/m     Wt Readings from Last 3 Encounters:  02/28/23 218 lb (98.9 kg)  02/18/23 218 lb 9.6 oz (99.2 kg)  11/22/22 215 lb (97.5 kg)     GEN: Well nourished, well developed in no acute distress CARDIAC: RRR, no murmurs, rubs, gallops RESPIRATORY:  Normal work of breathing MUSCULOSKELETAL: no edema    ASSESSMENT & PLAN:    Abbott CRT- D Device in place Device interrogated in clinic today. I reviewed the interrogation in detail Patient is not  device-dependent  CHFrEF Repeat TTE pending 04/2023 Continue carvedilol 25, entresto 24-26, farxiga 10, aldactone 25  Complete heart block       Signed, Grant Small, MD  02/28/2023 2:36 PM    [facility]

## 2023-03-04 ENCOUNTER — Telehealth: Payer: Self-pay

## 2023-03-04 DIAGNOSIS — Z79899 Other long term (current) drug therapy: Secondary | ICD-10-CM

## 2023-03-04 DIAGNOSIS — E782 Mixed hyperlipidemia: Secondary | ICD-10-CM

## 2023-03-04 NOTE — Telephone Encounter (Signed)
The patient has been notified of the result and verbalized understanding.  All questions (if any) were answered. Cindi Carbon [place], RN 03/04/2023 10:19 AM   Placed order for lab work.

## 2023-03-04 NOTE — Telephone Encounter (Signed)
-----   Message from Charlton Haws sent at 02/21/2023  8:02 AM EDT ----- LDL higher than wold want Is he taking PSK9 every 2 weeks repeat in 3 months

## 2023-03-10 NOTE — Progress Notes (Signed)
Remote ICD transmission.   

## 2023-03-22 ENCOUNTER — Other Ambulatory Visit: Payer: Self-pay | Admitting: Cardiovascular Disease

## 2023-04-18 ENCOUNTER — Other Ambulatory Visit: Payer: Self-pay | Admitting: Cardiovascular Disease

## 2023-04-19 MED ORDER — ENTRESTO 24-26 MG PO TABS: 1.0000 | ORAL_TABLET | Freq: Two times a day (BID) | ORAL | 3 refills | Status: DC

## 2023-04-26 ENCOUNTER — Ambulatory Visit (HOSPITAL_COMMUNITY)
Admission: RE | Admit: 2023-04-26 | Discharge: 2023-04-26 | Disposition: A | Discharge status: Home / Self Care | Payer: BC Managed Care – PPO | Source: Ambulatory Visit | Attending: Cardiovascular Disease | Admitting: Cardiovascular Disease

## 2023-04-26 DIAGNOSIS — R0989 Other specified symptoms and signs involving the circulatory and respiratory systems: Secondary | ICD-10-CM

## 2023-04-26 DIAGNOSIS — I442 Atrioventricular block, complete: Secondary | ICD-10-CM | POA: Diagnosis not present

## 2023-04-26 DIAGNOSIS — I420 Dilated cardiomyopathy: Secondary | ICD-10-CM

## 2023-04-26 DIAGNOSIS — E782 Mixed hyperlipidemia: Secondary | ICD-10-CM | POA: Diagnosis not present

## 2023-04-26 DIAGNOSIS — I5020 Unspecified systolic (congestive) heart failure: Secondary | ICD-10-CM | POA: Diagnosis not present

## 2023-04-27 ENCOUNTER — Telehealth: Payer: Self-pay

## 2023-04-27 DIAGNOSIS — R0989 Other specified symptoms and signs involving the circulatory and respiratory systems: Secondary | ICD-10-CM

## 2023-04-27 NOTE — Telephone Encounter (Signed)
-----   Message from Charlton Haws sent at 04/27/2023  1:57 PM EDT ----- 40-59% LICA stenosis.  F/U duplex in one year 40-59% RICA stenosis.  F/U duplex in 1 year.  F/u duplex in a year

## 2023-04-27 NOTE — Telephone Encounter (Signed)
Patient aware of results. Will place order for repeat in one year.

## 2023-04-28 ENCOUNTER — Ambulatory Visit (HOSPITAL_COMMUNITY): Payer: BC Managed Care – PPO | Attending: Cardiovascular Disease

## 2023-04-28 DIAGNOSIS — I5020 Unspecified systolic (congestive) heart failure: Secondary | ICD-10-CM | POA: Diagnosis not present

## 2023-04-28 DIAGNOSIS — R0989 Other specified symptoms and signs involving the circulatory and respiratory systems: Secondary | ICD-10-CM | POA: Diagnosis not present

## 2023-04-28 DIAGNOSIS — I442 Atrioventricular block, complete: Secondary | ICD-10-CM | POA: Diagnosis not present

## 2023-04-28 DIAGNOSIS — E782 Mixed hyperlipidemia: Secondary | ICD-10-CM

## 2023-04-28 DIAGNOSIS — I420 Dilated cardiomyopathy: Secondary | ICD-10-CM | POA: Diagnosis not present

## 2023-04-28 LAB — ECHOCARDIOGRAM COMPLETE
Area-P 1/2: 3.42 cm2
S' Lateral: 4.7 cm

## 2023-05-20 NOTE — Progress Notes (Signed)
CARDIOLOGY CONSULT NOTE       Patient ID: Grant Ochoa. MRN: [id number] DOB/AGE: 1956-04-08 67 y.o.  Admit date: (Not on file) Referring Physician: Watt Climes Primary Physician: Pcp, No Primary Cardiologist: Eden Emms    HPI:  67 y.o. 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 DAT scan negative   PMH indicates history of CAD with stent in 2010 Seen by Dr Judithe Modest cardiology [facility] in 2021 His note indicated NSTEMI 2010 with stenting of RCA   He is a smoker with over 69 packyear history Lung cancer CT negative 08/04/22.    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   Cath 08/11/22 showed CTO of RCA proximal to prior stent with weak left to right collaterals CTO mid LCX after OM 1 and diffuse 40-50% LAD Medical Rx thought best  Myovue done 07/09/22 showed old IMI estimated EF 29% Carotid with bilateral 40-59% ICA stenosis 04/28/23  TTE 04/28/23 EF 40-45% improved   Admitted to hospital with pre syncope and complete heart block that did not go away off beta blocker Abbott CRT-D placed by Dr Nelly Laurence on 11/23/22   TTE 04/28/23 40-45%   Started on Zetia to get LDL to target. Smoking down to < 1 ppd. Has a new CVO [address] [place]    ROS All other systems reviewed and negative except as noted  above  Past Medical History:  Diagnosis Date   Arthritis    BPH (benign prostatic hyperplasia)    CAD (coronary artery disease)    Coronary artery disease    GAD (generalized anxiety disorder)    Hyperglycemia    Hypertension    Lightheadedness    Myocardial infarction (HCC) 2010   stent   Syncope    Tremor     Family History  Problem Relation Age of Onset   Hypertension Mother    Hypertension Father    Aortic aneurysm Father     Social History   Socioeconomic History   Marital status: Married    Spouse name: Not on file   Number of children: Not on file   Years of education: Not on file   Highest education level: Not on file  Occupational History   Not on file  Tobacco Use   Smoking status: Every Day    Current packs/day: 1.00    Types: Cigarettes   Smokeless tobacco: Never  Vaping Use   Vaping status: Some Days  Substance and Sexual Activity   Alcohol use: Not Currently   Drug use: Never   Sexual activity: Not on file  Other Topics Concern   Not on file  Social History Narrative   Not on file   Social Determinants of Health   Financial  Resource Strain: Not on file  Food Insecurity: No Food Insecurity (11/24/2022)   Hunger Vital Sign    Worried About Running Out of Food in the Last Year: Never true    Ran Out of Food in the Last Year: Never true  Transportation Needs: No Transportation Needs (11/24/2022)   PRAPARE - Administrator, Civil Service (Medical): No    Lack of Transportation (Non-Medical): No  Physical Activity: Not on file  Stress: Not on file  Social Connections: Unknown (12/20/2021)   Received from [facility], [facility]   Social Network    Social Network: Not on file  Intimate Partner Violence: Not At Risk (11/24/2022)   Humiliation, Afraid, Rape, and Kick questionnaire    Fear of Current or Ex-Partner: No    Emotionally Abused: No    Physically Abused: No    Sexually Abused: No    Past Surgical History:  Procedure  Laterality Date   ARTERY AND TENDON REPAIR Left 07/15/2020   Procedure: LEFT INDEX , LONG,AND RING FINGER EXPLORATION ,REPAIR TENDON,ARTERY AND NERVE;  Surgeon: Betha Loa, MD;  Location: [facility] OR;  Service: Orthopedics;  Laterality: Left;   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   Procedure: BIV ICD INSERTION CRT-D;  Surgeon: Mealor, Roberts Gaudy, MD;  Location: [facility] INVASIVE CV LAB;  Service: Cardiovascular;  Laterality: N/A;   CORONARY ANGIOPLASTY  2010   LEFT HEART CATH AND CORONARY ANGIOGRAPHY N/A 08/11/2022   Procedure: LEFT HEART CATH AND CORONARY ANGIOGRAPHY;  Surgeon: Lyn Records, MD;  Location: [facility] INVASIVE CV LAB;  Service: Cardiovascular;  Laterality: N/A;   LIPOMA EXCISION  2012   forehead   TEMPORARY PACEMAKER N/A 11/23/2022   Procedure: TEMPORARY PACEMAKER;  Surgeon: Tonny Bollman, MD;  Location: [facility] INVASIVE CV LAB;  Service: Cardiovascular;  Laterality: N/A;      Current Outpatient Medications:    aspirin EC 81 MG tablet, Take 81 mg by mouth every evening. Swallow whole., Disp: , Rfl:    carvedilol (COREG) 12.5 MG tablet, Take 1 tablet (12.5 mg total) by mouth 2 (two) times daily with a meal., Disp: 180 tablet, Rfl: 1   Evolocumab (REPATHA SURECLICK) 140 MG/ML SOAJ, Inject 140 mg into the skin every 14 (fourteen) days., Disp: 2 mL, Rfl: 11   ezetimibe (ZETIA) 10 MG tablet, Take 1 tablet (10 mg total) by mouth daily., Disp: 90 tablet, Rfl: 3   FARXIGA 10 MG TABS tablet, Take 10 mg by mouth daily., Disp: , Rfl:    loratadine-pseudoephedrine (CLARITIN-D 12-HOUR) 5-120 MG tablet, Take 1 tablet by mouth daily as needed for allergies., Disp: , Rfl:    naproxen sodium (ALEVE) 220 MG tablet, Take 440 mg by mouth daily as needed (pain)., Disp: , Rfl:    sacubitril-valsartan (ENTRESTO) 24-26 MG, Take 1 tablet by mouth 2 (two) times daily., Disp: 180 tablet, Rfl: 3   spironolactone (ALDACTONE) 25 MG tablet, Take 0.5 tablets (12.5 mg total) by mouth daily., Disp: 90 tablet, Rfl: 1   tadalafil  (CIALIS) 5 MG tablet, Take 5 mg by mouth every evening., Disp: , Rfl:   Current Facility-Administered Medications:    sodium chloride flush (NS) 0.9 % injection 3 mL, 3 mL, Intravenous, Q12H, Wendall Stade, MD    Physical Exam: Blood pressure 100/66, pulse 65, height 5\' 10"  (1.778 m), weight 218 lb (98.9 kg), SpO2 91%.    Affect appropriate Chronically ill male HEENT: normal Neck supple with no adenopathy JVP normal bilateral bruits no thyromegaly  Lungs clear with no wheezing and good diaphragmatic motion Heart:  S1/S2 SEM murmur, no rub, gallop or click PMI enlarged AICD under left clavicle  Abdomen: benighn, BS positve, no tenderness, no AAA no bruit.  No HSM or HJR Distal pulses palpable  No edema Neuro non-focal Skin warm and dry No muscular weakness  Labs:   Lab Results  Component Value Date   WBC 7.3 11/24/2022   HGB 16.4 11/24/2022   HCT 46.4 11/24/2022   MCV 87.4 11/24/2022   PLT 200 11/24/2022    Recent Labs  Lab 05/25/23 0924  PROT 6.5  BILITOT 0.5  ALKPHOS 70  ALT 15  AST 17   No results found for: "CKTOTAL", "CKMB", "CKMBINDEX", "TROPONINI"  Lab Results  Component Value Date   CHOL 162 05/25/2023   CHOL 184 02/18/2023   Lab Results  Component Value Date   HDL 42 05/25/2023   HDL 40 02/18/2023   Lab Results  Component Value Date   LDLCALC 92 05/25/2023   LDLCALC 107 (H) 02/18/2023   Lab Results  Component Value Date   TRIG 161 (H) 05/25/2023   TRIG 215 (H) 02/18/2023   Lab Results  Component Value Date   CHOLHDL 3.9 05/25/2023   CHOLHDL 4.6 02/18/2023   No results found for: "LDLDIRECT"    Radiology: CUP PACEART REMOTE DEVICE CHECK  Result Date: 05/26/2023 Scheduled remote reviewed. Normal device function.  Within the monitoring period, HF diagnostics have been abnormal. Next remote 91 days. - CS, CVRS   EKG: SR rate 72 LAD RBBB PR 212 msec ? Old IMI    ASSESSMENT AND PLAN:   CAD:  distant stent to RCA in 2010 occluded  RCA/LCX with moderate LAD dx by cath 08/11/22 Med Rx HLD:  now on Repatha lipitor caused nausea and GI upset   LDL 92 also started on Zetia f/u labs in 3 months  Smoking: counseled on cessation < 10 minutes Lung cancer CT negative 07/15/22  CRT-D with CHB and PVC burden Abbott CRT-D placed 11/24/22 F/U with Dr Nelly Laurence Normal function  HTN:  continue current meds Bruit:  bilateral 40-59% ICA stenosis f/u duplex, repeat September 2025  Ischemic DCM:  EF  improved 40-45% by TTE 04/28/23  GDMT with coreg, farxiga, entresto and aldactone    Lipid/Liver 3 month   F/U in 6 months   Signed: Charlton Haws 05/31/2023, 10:46 AM

## 2023-05-25 ENCOUNTER — Ambulatory Visit: Payer: BC Managed Care – PPO | Attending: Cardiovascular Disease

## 2023-05-25 DIAGNOSIS — E782 Mixed hyperlipidemia: Secondary | ICD-10-CM | POA: Diagnosis not present

## 2023-05-25 DIAGNOSIS — Z79899 Other long term (current) drug therapy: Secondary | ICD-10-CM | POA: Diagnosis not present

## 2023-05-25 LAB — HEPATIC FUNCTION PANEL
ALT: 15 [IU]/L (ref 0–44)
AST: 17 [IU]/L (ref 0–40)
Albumin: 4.1 g/dL (ref 3.9–4.9)
Alkaline Phosphatase: 70 [IU]/L (ref 44–121)
Bilirubin Total: 0.5 mg/dL (ref 0.0–1.2)
Bilirubin, Direct: 0.15 mg/dL (ref 0.00–0.40)
Total Protein: 6.5 g/dL (ref 6.0–8.5)

## 2023-05-25 LAB — LIPID PANEL
Chol/HDL Ratio: 3.9 {ratio} (ref 0.0–5.0)
Cholesterol, Total: 162 mg/dL (ref 100–199)
HDL: 42 mg/dL (ref >39)
LDL Chol Calc (NIH): 92 mg/dL (ref 0–99)
Triglycerides: 161 mg/dL — ABNORMAL HIGH (ref 0–149)
VLDL Cholesterol Cal: 28 mg/dL (ref 5–40)

## 2023-05-26 ENCOUNTER — Ambulatory Visit (INDEPENDENT_AMBULATORY_CARE_PROVIDER_SITE_OTHER): Payer: BC Managed Care – PPO

## 2023-05-26 DIAGNOSIS — I442 Atrioventricular block, complete: Secondary | ICD-10-CM

## 2023-05-26 LAB — CUP PACEART REMOTE DEVICE CHECK
Battery Remaining Longevity: 92 mo
Battery Remaining Percentage: 89 %
Battery Voltage: 2.99 V
Brady Statistic AP VP Percent: 18 %
Brady Statistic AP VS Percent: 1 %
Brady Statistic AS VP Percent: 74 %
Brady Statistic AS VS Percent: 3.6 %
Brady Statistic RA Percent Paced: 13 %
Date Time Interrogation Session: 20241017021638
HighPow Impedance: 74 Ohm
Implantable Lead Connection Status: 753985
Implantable Lead Connection Status: 753985
Implantable Lead Connection Status: 753985
Implantable Lead Implant Date: 20240417
Implantable Lead Implant Date: 20240417
Implantable Lead Implant Date: 20240417
Implantable Lead Location: 753858
Implantable Lead Location: 753859
Implantable Lead Location: 753860
Implantable Pulse Generator Implant Date: 20240417
Lead Channel Impedance Value: 390 Ohm
Lead Channel Impedance Value: 580 Ohm
Lead Channel Impedance Value: 810 Ohm
Lead Channel Pacing Threshold Amplitude: 0.5 V
Lead Channel Pacing Threshold Amplitude: 0.625 V
Lead Channel Pacing Threshold Amplitude: 1 V
Lead Channel Pacing Threshold Pulse Width: 0.5 ms
Lead Channel Pacing Threshold Pulse Width: 0.5 ms
Lead Channel Pacing Threshold Pulse Width: 0.5 ms
Lead Channel Sensing Intrinsic Amplitude: 1.8 mV
Lead Channel Sensing Intrinsic Amplitude: 11.9 mV
Lead Channel Setting Pacing Amplitude: 1.5 V
Lead Channel Setting Pacing Amplitude: 1.5 V
Lead Channel Setting Pacing Amplitude: 1.625
Lead Channel Setting Pacing Pulse Width: 0.5 ms
Lead Channel Setting Pacing Pulse Width: 0.5 ms
Lead Channel Setting Sensing Sensitivity: 0.5 mV
Pulse Gen Serial Number: 111060096

## 2023-05-30 ENCOUNTER — Telehealth: Payer: Self-pay

## 2023-05-30 ENCOUNTER — Other Ambulatory Visit: Payer: Self-pay

## 2023-05-30 DIAGNOSIS — E782 Mixed hyperlipidemia: Secondary | ICD-10-CM

## 2023-05-30 MED ORDER — EZETIMIBE 10 MG PO TABS: 10.0000 mg | ORAL_TABLET | Freq: Every day | ORAL | 3 refills | Status: DC

## 2023-05-30 NOTE — Telephone Encounter (Signed)
-----   Message from Charlton Haws sent at 05/25/2023  5:09 PM EDT ----- LDL above goal make sure hi is injecting every 2 weeks if so add Zetia 10 mg and repeat in 3 months

## 2023-05-30 NOTE — Telephone Encounter (Signed)
The patient has been notified of the result and verbalized understanding.  All questions (if any) were answered. Cindi Carbon [place], RN 05/30/2023 1:27 PM   Patient stated he is taking his injections every 2 weeks, so we will add zetia 10 mg by mouth and get lab work in 3 months.

## 2023-05-31 ENCOUNTER — Encounter: Payer: Self-pay | Admitting: Cardiovascular Disease

## 2023-05-31 ENCOUNTER — Ambulatory Visit: Payer: BC Managed Care – PPO | Attending: Cardiovascular Disease | Admitting: Cardiovascular Disease

## 2023-05-31 ENCOUNTER — Other Ambulatory Visit: Payer: Self-pay

## 2023-05-31 VITALS — BP 100/66 | HR 65 | Ht 70.0 in | Wt 218.0 lb

## 2023-05-31 DIAGNOSIS — R0989 Other specified symptoms and signs involving the circulatory and respiratory systems: Secondary | ICD-10-CM

## 2023-05-31 DIAGNOSIS — I255 Ischemic cardiomyopathy: Secondary | ICD-10-CM

## 2023-05-31 DIAGNOSIS — I25118 Atherosclerotic heart disease of native coronary artery with other forms of angina pectoris: Secondary | ICD-10-CM

## 2023-05-31 DIAGNOSIS — Z87891 Personal history of nicotine dependence: Secondary | ICD-10-CM | POA: Diagnosis not present

## 2023-05-31 DIAGNOSIS — I10 Essential (primary) hypertension: Secondary | ICD-10-CM | POA: Diagnosis not present

## 2023-05-31 DIAGNOSIS — E785 Hyperlipidemia, unspecified: Secondary | ICD-10-CM

## 2023-05-31 NOTE — Patient Instructions (Addendum)
Medication Instructions:  Your physician recommends that you continue on your current medications as directed. Please refer to the Current Medication list given to you today.  *If you need a refill on your cardiac medications before your next appointment, please call your pharmacy*  Lab Work: Your physician recommends that you return for lab work in: 3 months for lipid and liver panel.  If you have labs (blood work) drawn today and your tests are completely normal, you will receive your results only by: MyChart Message (if you have MyChart) OR A paper copy in the mail If you have any lab test that is abnormal or we need to change your treatment, we will call you to review the results.  Testing/Procedures: Your physician has requested that you have a carotid duplex in September This test is an ultrasound of the carotid arteries in your neck. It looks at blood flow through these arteries that supply the brain with blood. Allow one hour for this exam. There are no restrictions or special instructions.  Follow-Up: At [facility], you and your health needs are our priority.  As part of our continuing mission to provide you with exceptional heart care, we have created designated Provider Care Teams.  These Care Teams include your primary Cardiologist (physician) and Advanced Practice Providers (APPs -  Physician Assistants and Nurse Practitioners) who all work together to provide you with the care you need, when you need it.  We recommend signing up for the patient portal called "MyChart".  Sign up information is provided on this After Visit Summary.  MyChart is used to connect with patients for Virtual Visits (Telemedicine).  Patients are able to view lab/test results, encounter notes, upcoming appointments, etc.  Non-urgent messages can be sent to your provider as well.   To learn more about what you can do with MyChart, go to [email or web address].    Your next appointment:   6  month(s)  Provider:   Charlton Haws, MD

## 2023-06-14 NOTE — Progress Notes (Signed)
Remote ICD transmission.   

## 2023-06-23 ENCOUNTER — Other Ambulatory Visit: Payer: Self-pay | Admitting: *Deleted

## 2023-06-23 MED ORDER — FARXIGA 10 MG PO TABS: 10.0000 mg | ORAL_TABLET | Freq: Every day | ORAL | 3 refills | Status: DC

## 2023-06-27 ENCOUNTER — Other Ambulatory Visit: Payer: Self-pay

## 2023-06-27 MED ORDER — FARXIGA 10 MG PO TABS: 10.0000 mg | ORAL_TABLET | Freq: Every day | ORAL | 3 refills | Status: DC

## 2023-06-27 MED ORDER — FLUOXETINE HCL 20 MG PO CAPS: 20.0000 mg | ORAL_CAPSULE | Freq: Every day | ORAL | 3 refills | Status: DC

## 2023-06-27 NOTE — Telephone Encounter (Signed)
-----   Message from Charlton Haws sent at 06/27/2023 10:45 AM EST ----- Needs 90 day scripts with 3 refills for Prosac 30 mg daily and Farxiga 10 mg daily

## 2023-06-27 NOTE — Telephone Encounter (Signed)
Wendall Stade, MD  Ethelda Chick, RN Meant Prozac 20 mg   Placed order for Prozac after clarification.

## 2023-06-27 NOTE — Addendum Note (Signed)
Addended by: Virl Axe, Macyn Remmert L on: 06/27/2023 02:50 PM   Modules accepted: Orders

## 2023-07-12 ENCOUNTER — Other Ambulatory Visit: Payer: Self-pay | Admitting: Cardiology

## 2023-07-14 ENCOUNTER — Other Ambulatory Visit: Payer: Self-pay

## 2023-07-14 MED ORDER — CARVEDILOL 12.5 MG PO TABS: 12.5000 mg | ORAL_TABLET | Freq: Two times a day (BID) | ORAL | 3 refills | Status: DC

## 2023-07-14 NOTE — Progress Notes (Signed)
Per Dr. Eden Emms, refill patient's coreg 12.5 mg BID at his local pharmacy.

## 2023-07-28 ENCOUNTER — Ambulatory Visit: Payer: BC Managed Care – PPO | Admitting: Family Medicine

## 2023-07-29 ENCOUNTER — Ambulatory Visit: Payer: BC Managed Care – PPO | Admitting: Family Medicine

## 2023-08-05 NOTE — Progress Notes (Unsigned)
[name] [address] [place] [address] Phone: [phone] Subjective:   Grant Ochoa, am serving as a scribe for Dr. Antoine Primas.  I'm seeing this patient by the request  of:  Pcp, No  CC: left shoulder pain   ION:[id number]  09/26/2020 Patient given injection and tolerated the procedure well.  Discussed which activities to doing which one to avoid.  Patient has not had any injection in this area but did have swelling on ultrasound.  Follow-up again in 6 to 12 weeks     Repeat injection given again today.  We did discuss about again the advanced imaging and did not consider it.  Patient states though that unfortunately because of his recent hand surgery and pain he would like to get through this before he would consider anything else.  Patient does feel like he has made progress.  We discussed continuing the gabapentin.  Discussed other medications that could be helpful as well.  Hoping that patient does make good progress.  Follow-up again in 6 to 12 weeks.  Once again I will encourage advanced imaging if he continues to have difficulty     Updated 08/08/2023 Grant Ochoa. is a 67 y.o. male coming in with complaint of L shoulder pain, seen a long time ago for left shoulder pain with TRC tear, given injection in 2022. Pain is under armpit and can radiate down his arm. Pain has been manageable since last visit but is worsening. Cold weather causes increase in pain.  Also c/o R shoulder pain over anterior aspect for past 2 months. Feels he injured R shoulder when compensating for L shoulder.   Also c/o injury to R forearm over 1 year ago when his dog ran into his arm. Feels like he has lost strength in arm and unable to throw blal to his dog side armed.   Xray in 2021 showed arthropathy    ECHO in septemeber showed improvement in EF  Past Medical History:  Diagnosis Date   Arthritis    BPH (benign prostatic hyperplasia)    CAD  (coronary artery disease)    Coronary artery disease    GAD (generalized anxiety disorder)    Hyperglycemia    Hypertension    Lightheadedness    Myocardial infarction [facility]) 2010   stent   Syncope    Tremor    Past Surgical History:  Procedure Laterality Date   ARTERY AND TENDON REPAIR Left 07/15/2020   Procedure: LEFT INDEX , LONG,AND RING FINGER EXPLORATION ,REPAIR TENDON,ARTERY AND NERVE;  Surgeon: Betha Loa, MD;  Location: [facility] OR;  Service: Orthopedics;  Laterality: Left;   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   Procedure: BIV ICD INSERTION CRT-D;  Surgeon: Mealor, Roberts Gaudy, MD;  Location: [facility] INVASIVE CV LAB;  Service: Cardiovascular;  Laterality: N/A;   CORONARY ANGIOPLASTY  2010   LEFT HEART CATH AND CORONARY ANGIOGRAPHY N/A 08/11/2022   Procedure: LEFT HEART CATH AND CORONARY ANGIOGRAPHY;  Surgeon: Lyn Records, MD;  Location: [facility] INVASIVE CV LAB;  Service: Cardiovascular;  Laterality: N/A;   LIPOMA EXCISION  2012   forehead   TEMPORARY PACEMAKER N/A 11/23/2022   Procedure: TEMPORARY PACEMAKER;  Surgeon: Tonny Bollman, MD;  Location: [facility] INVASIVE CV LAB;  Service: Cardiovascular;  Laterality: N/A;   Social History   Socioeconomic History   Marital status: Married    Spouse name: Not on file   Number of children: Not on file  Years of education: Not on file   Highest education level: Not on file  Occupational History   Not on file  Tobacco Use   Smoking status: Every Day    Current packs/day: 1.00    Types: Cigarettes   Smokeless tobacco: Never  Vaping Use   Vaping status: Some Days  Substance and Sexual Activity   Alcohol use: Not Currently   Drug use: Never   Sexual activity: Not on file  Other Topics Concern   Not on file  Social History Narrative   Not on file   Social Drivers of Health   Financial Resource Strain: Not on file  Food Insecurity: No Food Insecurity (11/24/2022)   Hunger Vital Sign    Worried About Running Out of Food in the Last  Year: Never true    Ran Out of Food in the Last Year: Never true  Transportation Needs: No Transportation Needs (11/24/2022)   PRAPARE - Administrator, Civil Service (Medical): No    Lack of Transportation (Non-Medical): No  Physical Activity: Not on file  Stress: Not on file  Social Connections: Unknown (12/20/2021)   [facility] from [facility]   Social Network    Social Network: Not on file   Allergies  Allergen Reactions   Chantix [Varenicline] Nausea Only    Chantix Crazy Dreams   Lipitor [Atorvastatin] Nausea Only    GI upset   Family History  Problem Relation Age of Onset   Hypertension Mother    Hypertension Father    Aortic aneurysm Father     Current Outpatient Medications (Endocrine & Metabolic):    FARXIGA 10 MG TABS tablet, Take 1 tablet (10 mg total) by mouth daily.   Current Outpatient Medications (Cardiovascular):    carvedilol (COREG) 12.5 MG tablet, TAKE 1 TABLET BY MOUTH TWICE DAILY WITH A MEAL   carvedilol (COREG) 12.5 MG tablet, Take 1 tablet (12.5 mg total) by mouth 2 (two) times daily with a meal.   Evolocumab (REPATHA SURECLICK) 140 MG/ML SOAJ, Inject 140 mg into the skin every 14 (fourteen) days.   ezetimibe (ZETIA) 10 MG tablet, Take 1 tablet (10 mg total) by mouth daily.   sacubitril-valsartan (ENTRESTO) 24-26 MG, Take 1 tablet by mouth 2 (two) times daily.   spironolactone (ALDACTONE) 25 MG tablet, Take 0.5 tablets (12.5 mg total) by mouth daily.   tadalafil (CIALIS) 5 MG tablet, Take 5 mg by mouth every evening.   Current Outpatient Medications (Respiratory):    loratadine-pseudoephedrine (CLARITIN-D 12-HOUR) 5-120 MG tablet, Take 1 tablet by mouth daily as needed for allergies.   Current Outpatient Medications (Analgesics):    aspirin EC 81 MG tablet, Take 81 mg by mouth every evening. Swallow whole.   naproxen sodium (ALEVE) 220 MG tablet, Take 440 mg by mouth daily as needed (pain).     Current Outpatient  Medications (Other):    FLUoxetine (PROZAC) 20 MG capsule, Take 1 capsule (20 mg total) by mouth daily.  Current Facility-Administered Medications (Other):    sodium chloride flush (NS) 0.9 % injection 3 mL   Reviewed prior external information including notes and imaging from  primary care provider As well as notes that were available from care everywhere and other healthcare systems.  Past medical history, social, surgical and family history all reviewed in electronic medical record.  No pertanent information unless stated regarding to the chief complaint.   Review of Systems:  No headache, visual changes, nausea, vomiting, diarrhea, constipation, dizziness,  abdominal pain, skin rash, fevers, chills, night sweats, weight loss, swollen lymph nodes, body aches, joint swelling, chest pain, shortness of breath, mood changes. POSITIVE muscle aches  Objective  Blood pressure 102/68, pulse 62, height 5\' 10"  (1.778 m), weight 223 lb (101.2 kg), SpO2 97%.   General: No apparent distress alert and oriented x3 mood and affect normal, dressed appropriately.  HEENT: Pupils equal, extraocular movements intact  Respiratory: Patient's speak in full sentences and does not appear short of breath  Cardiovascular: No lower extremity edema, non tender, no erythema  Shoulder exam shows patient does have bilateral impingement noted.  Still some mild weakness with 4 out of 5 strength on the left.  Positive crossover bilaterally.  Procedure: Real-time Ultrasound Guided Injection of right glenohumeral joint Device: GE Logiq Q7  Ultrasound guided injection is preferred based studies that show increased duration, increased effect, greater accuracy, decreased procedural pain, increased response rate with ultrasound guided versus blind injection.  Verbal informed consent obtained.  Time-out conducted.  Noted no overlying erythema, induration, or other signs of local infection.  Skin prepped in a sterile fashion.   Local anesthesia: Topical Ethyl chloride.  With sterile technique and under real time ultrasound guidance:  Joint visualized.  23g 1  inch needle inserted posterior approach. Pictures taken for needle placement. Patient did have injection of 2 cc of 1% lidocaine, 2 cc of 0.5% Marcaine, and 1.0 cc of Kenalog 40 mg/dL. Completed without difficulty  Pain immediately resolved suggesting accurate placement of the medication.  Advised to call if fevers/chills, erythema, induration, drainage, or persistent bleeding.  Impression: Technically successful ultrasound guided injection.  Procedure: Real-time Ultrasound Guided Injection of right acromioclavicular joint Device: GE Logiq Q7 Ultrasound guided injection is preferred based studies that show increased duration, increased effect, greater accuracy, decreased procedural pain, increased response rate, and decreased cost with ultrasound guided versus blind injection.  Verbal informed consent obtained.  Time-out conducted.  Noted no overlying erythema, induration, or other signs of local infection.  Skin prepped in a sterile fashion.  Local anesthesia: Topical Ethyl chloride.  With sterile technique and under real time ultrasound guidance: With a 25-gauge half inch needle injected with 0.5 cc of 0.5% Marcaine and 0.5 cc of Kenalog 40 mg/mL Completed without difficulty  Pain immediately resolved suggesting accurate placement of the medication.  Advised to call if fevers/chills, erythema, induration, drainage, or persistent bleeding.  Impression: Technically successful ultrasound guided injection.  Procedure: Real-time Ultrasound Guided Injection of left glenohumeral joint Device: GE Logiq E  Ultrasound guided injection is preferred based studies that show increased duration, increased effect, greater accuracy, decreased procedural pain, increased response rate with ultrasound guided versus blind injection.  Verbal informed consent obtained.   Time-out conducted.  Noted no overlying erythema, induration, or other signs of local infection.  Skin prepped in a sterile fashion.  Local anesthesia: Topical Ethyl chloride.  With sterile technique and under real time ultrasound guidance:  Joint visualized.  21g 2 inch needle inserted posterior approach. Pictures taken for needle placement. Patient did have injection of 2 cc of 0.5% Marcaine, and 1cc of Kenalog 40 mg/dL. Completed without difficulty  Pain immediately resolved suggesting accurate placement of the medication.  Advised to call if fevers/chills, erythema, induration, drainage, or persistent bleeding.  Images permanently stored and available for review in the ultrasound unit.  Impression: Technically successful ultrasound guided injection.  Procedure: Real-time Ultrasound Guided Injection of left acromioclavicular joint Device: GE Logiq Q7 Ultrasound guided injection is preferred based  studies that show increased duration, increased effect, greater accuracy, decreased procedural pain, increased response rate, and decreased cost with ultrasound guided versus blind injection.  Verbal informed consent obtained.  Time-out conducted.  Noted no overlying erythema, induration, or other signs of local infection.  Skin prepped in a sterile fashion.  Local anesthesia: Topical Ethyl chloride.  With sterile technique and under real time ultrasound guidance: With a 25-gauge half inch needle injected with 0.5 cc of 0.5% Marcaine and 0.5 cc of Kenalog 40 mg/mL Completed without difficulty  Pain immediately resolved suggesting accurate placement of the medication.  Advised to call if fevers/chills, erythema, induration, drainage, or persistent bleeding.  Impression: Technically successful ultrasound guided injection.     Impression and Recommendations:    The above documentation has been reviewed and is accurate and complete Judi Saa, DO

## 2023-08-08 ENCOUNTER — Encounter: Payer: Self-pay | Admitting: Family Medicine

## 2023-08-08 ENCOUNTER — Other Ambulatory Visit: Payer: Self-pay

## 2023-08-08 ENCOUNTER — Ambulatory Visit (INDEPENDENT_AMBULATORY_CARE_PROVIDER_SITE_OTHER): Payer: BC Managed Care – PPO | Admitting: Family Medicine

## 2023-08-08 VITALS — BP 102/68 | HR 62 | Ht 70.0 in | Wt 223.0 lb

## 2023-08-08 DIAGNOSIS — M25512 Pain in left shoulder: Secondary | ICD-10-CM | POA: Diagnosis not present

## 2023-08-08 DIAGNOSIS — M19012 Primary osteoarthritis, left shoulder: Secondary | ICD-10-CM | POA: Diagnosis not present

## 2023-08-08 DIAGNOSIS — M75102 Unspecified rotator cuff tear or rupture of left shoulder, not specified as traumatic: Secondary | ICD-10-CM

## 2023-08-08 DIAGNOSIS — M7551 Bursitis of right shoulder: Secondary | ICD-10-CM | POA: Diagnosis not present

## 2023-08-08 DIAGNOSIS — G8929 Other chronic pain: Secondary | ICD-10-CM

## 2023-08-08 DIAGNOSIS — M19011 Primary osteoarthritis, right shoulder: Secondary | ICD-10-CM | POA: Diagnosis not present

## 2023-08-08 NOTE — Assessment & Plan Note (Signed)
Injection given today and tolerated the procedure well, discussed icing regimen and home exercises, discussed which activities to do and which ones to avoid.  Discussed which activities will be beneficial in which ones could cause some irritation.  Follow-up with me again in 6 to 8 weeks.

## 2023-08-08 NOTE — Patient Instructions (Addendum)
Injected both shoulders in 2 places See me again in 2 months

## 2023-08-08 NOTE — Assessment & Plan Note (Signed)
Hide injection given today, tolerated procedure well, no compensation for the contralateral side but likely no significant tearing noted of the rotator cuff.  Increase activity slowly.  Follow-up again in 6 to 8 weeks

## 2023-08-08 NOTE — Assessment & Plan Note (Signed)
Patient does have arthritic changes noted.  Discussed which activities to do and which ones to avoid.  Worsening pain advanced imaging would be necessary.  Believe the patient should do well with the conservative therapy.  Follow-up again in 6 to 8 weeks otherwise.

## 2023-08-08 NOTE — Assessment & Plan Note (Signed)
Seems to be doing relatively well with a relatively good range of motion still.  We discussed the possibility of further imaging but patient does not think it would change any progression.  Discussed icing regimen and home exercises, which activities to do and which ones to avoid.  Increase activity otherwise.  Follow-up again in 6 to 8 weeks.

## 2023-08-22 ENCOUNTER — Emergency Department (HOSPITAL_BASED_OUTPATIENT_CLINIC_OR_DEPARTMENT_OTHER)
Admission: EM | Admit: 2023-08-22 | Discharge: 2023-08-22 | Disposition: A | Discharge status: Home / Self Care | Payer: BC Managed Care – PPO | Attending: Emergency Medicine | Admitting: Emergency Medicine

## 2023-08-22 ENCOUNTER — Telehealth: Payer: Self-pay | Admitting: Cardiovascular Disease

## 2023-08-22 ENCOUNTER — Emergency Department (HOSPITAL_BASED_OUTPATIENT_CLINIC_OR_DEPARTMENT_OTHER): Payer: BC Managed Care – PPO

## 2023-08-22 ENCOUNTER — Other Ambulatory Visit: Payer: Self-pay

## 2023-08-22 ENCOUNTER — Encounter (HOSPITAL_BASED_OUTPATIENT_CLINIC_OR_DEPARTMENT_OTHER): Payer: Self-pay | Admitting: Emergency Medicine

## 2023-08-22 DIAGNOSIS — D750 Familial erythrocytosis: Secondary | ICD-10-CM | POA: Insufficient documentation

## 2023-08-22 DIAGNOSIS — Z79899 Other long term (current) drug therapy: Secondary | ICD-10-CM | POA: Diagnosis not present

## 2023-08-22 DIAGNOSIS — E871 Hypo-osmolality and hyponatremia: Secondary | ICD-10-CM | POA: Insufficient documentation

## 2023-08-22 DIAGNOSIS — Z7982 Long term (current) use of aspirin: Secondary | ICD-10-CM | POA: Diagnosis not present

## 2023-08-22 DIAGNOSIS — Z95810 Presence of automatic (implantable) cardiac defibrillator: Secondary | ICD-10-CM | POA: Diagnosis not present

## 2023-08-22 DIAGNOSIS — I10 Essential (primary) hypertension: Secondary | ICD-10-CM | POA: Insufficient documentation

## 2023-08-22 DIAGNOSIS — Z8679 Personal history of other diseases of the circulatory system: Secondary | ICD-10-CM | POA: Insufficient documentation

## 2023-08-22 DIAGNOSIS — I2510 Atherosclerotic heart disease of native coronary artery without angina pectoris: Secondary | ICD-10-CM | POA: Diagnosis not present

## 2023-08-22 DIAGNOSIS — D751 Secondary polycythemia: Secondary | ICD-10-CM

## 2023-08-22 DIAGNOSIS — M79602 Pain in left arm: Secondary | ICD-10-CM | POA: Insufficient documentation

## 2023-08-22 LAB — BASIC METABOLIC PANEL
Anion gap: 9 (ref 5–15)
BUN: 20 mg/dL (ref 8–23)
CO2: 21 mmol/L — ABNORMAL LOW (ref 22–32)
Calcium: 9.1 mg/dL (ref 8.9–10.3)
Chloride: 100 mmol/L (ref 98–111)
Creatinine, Ser: 1.05 mg/dL (ref 0.61–1.24)
GFR, Estimated: >60 mL/min (ref >60)
Glucose, Bld: 160 mg/dL — ABNORMAL HIGH (ref 70–99)
Potassium: 4.4 mmol/L (ref 3.5–5.1)
Sodium: 130 mmol/L — ABNORMAL LOW (ref 135–145)

## 2023-08-22 LAB — CBC
HCT: 54.6 % — ABNORMAL HIGH (ref 39.0–52.0)
Hemoglobin: 19.2 g/dL — ABNORMAL HIGH (ref 13.0–17.0)
MCH: 31.7 pg (ref 26.0–34.0)
MCHC: 35.2 g/dL (ref 30.0–36.0)
MCV: 90.2 fL (ref 80.0–100.0)
Platelets: 231 10*3/uL (ref 150–400)
RBC: 6.05 MIL/uL — ABNORMAL HIGH (ref 4.22–5.81)
RDW: 13.1 % (ref 11.5–15.5)
WBC: 11.1 10*3/uL — ABNORMAL HIGH (ref 4.0–10.5)
nRBC: 0 % (ref 0.0–0.2)

## 2023-08-22 LAB — TROPONIN I (HIGH SENSITIVITY)
Troponin I (High Sensitivity): 43 ng/L — ABNORMAL HIGH (ref <18)
Troponin I (High Sensitivity): 46 ng/L — ABNORMAL HIGH (ref <18)

## 2023-08-22 MED ORDER — SODIUM CHLORIDE 0.9 % IV BOLUS
1000.0000 mL | Freq: Once | INTRAVENOUS | Status: AC
  Administered 2023-08-22: 1000 mL via INTRAVENOUS

## 2023-08-22 NOTE — ED Provider Notes (Addendum)
 [place] [facility] Provider Note   CSN: [id number] Arrival date & time: 08/22/23  1749     History  Chief Complaint  Patient presents with   Arm Pain    Grant Ochoa. is a 68 y.o. male.  Pt c/o left arm pain in past few days. Occurs at rest, or with feeling stressed/anxious, or when out in cold. Denies any pain or discomfort with exercise/exertion. No chest pain or discomfort, and indicates does not feel like prior cardiac symptoms/cardiac pain. No associated nv, diaphoresis or sob. Is episodic, lasts 5-10 minutes.  Describes recent episodes exercise/activity/significant walking, with no chest pain or discomfort, no arm pain.  No constant pain. No pleuritic pain. No exertional pain.  No neck pain or radicular pain. No associated numbness/weakness. Notes recent musculoskeletal pain in left shoulder, recent injection for same a couple weeks ago.  No arm swelling or redness. No fever or chills.   The history is provided by the patient, the spouse and medical records.  Arm Pain Pertinent negatives include no chest pain, no abdominal pain, no headaches and no shortness of breath.       Home Medications Prior to Admission medications   Medication Sig Start Date End Date Taking? Authorizing Provider  aspirin  EC 81 MG tablet Take 81 mg by mouth every evening. Swallow whole.    [provider]  carvedilol  (COREG ) 12.5 MG tablet TAKE 1 TABLET BY MOUTH TWICE DAILY WITH A MEAL 07/15/23   Camnitz, Soyla Lunger, MD  carvedilol  (COREG ) 12.5 MG tablet Take 1 tablet (12.5 mg total) by mouth 2 (two) times daily with a meal. 07/14/23   Delford Maude BROCKS, MD  Evolocumab  (REPATHA  SURECLICK) 140 MG/ML SOAJ Inject 140 mg into the skin every 14 (fourteen) days. 08/31/22   Nishan, Peter C, MD  ezetimibe  (ZETIA ) 10 MG tablet Take 1 tablet (10 mg total) by mouth daily. 05/30/23   Delford Maude BROCKS, MD  FARXIGA  10 MG TABS tablet Take 1 tablet (10 mg total) by  mouth daily. 06/27/23   Delford Maude BROCKS, MD  FLUoxetine  (PROZAC ) 20 MG capsule Take 1 capsule (20 mg total) by mouth daily. 06/27/23   Nishan, Peter C, MD  loratadine-pseudoephedrine (CLARITIN-D 12-HOUR) 5-120 MG tablet Take 1 tablet by mouth daily as needed for allergies.    [provider]  naproxen sodium (ALEVE) 220 MG tablet Take 440 mg by mouth daily as needed (pain).    [provider]  sacubitril -valsartan  (ENTRESTO ) 24-26 MG Take 1 tablet by mouth 2 (two) times daily. 04/19/23   Nishan, Peter C, MD  spironolactone  (ALDACTONE ) 25 MG tablet Take 0.5 tablets (12.5 mg total) by mouth daily. 08/20/22   Wyn Jackee VEAR Raddle., NP  tadalafil (CIALIS) 5 MG tablet Take 5 mg by mouth every evening. 04/19/21   [provider]      Allergies    Chantix [varenicline] and Lipitor [atorvastatin]    Review of Systems   Review of Systems  Constitutional:  Negative for fever.  Respiratory:  Negative for cough and shortness of breath.   Cardiovascular:  Negative for chest pain, palpitations and leg swelling.  Gastrointestinal:  Negative for abdominal pain, nausea and vomiting.  Genitourinary:  Negative for flank pain.  Musculoskeletal:  Negative for back pain and neck pain.  Skin:  Negative for rash.  Neurological:  Negative for headaches.    Physical Exam Updated Vital Signs BP (!) 147/79   Pulse 67  Temp 98.8 F (37.1 C)   Resp (!) 21   SpO2 95%  Physical Exam Vitals and nursing note reviewed.  Constitutional:      Appearance: Normal appearance. He is well-developed.  HENT:     Head: Atraumatic.     Nose: Nose normal.     Mouth/Throat:     Mouth: Mucous membranes are moist.  Eyes:     General: No scleral icterus.    Conjunctiva/sclera: Conjunctivae normal.  Neck:     Trachea: No tracheal deviation.  Cardiovascular:     Rate and Rhythm: Normal rate and regular rhythm.     Pulses: Normal pulses.     Heart sounds: Normal heart sounds. No murmur heard.    No  friction rub. No gallop.  Pulmonary:     Effort: Pulmonary effort is normal. No accessory muscle usage or respiratory distress.     Breath sounds: Normal breath sounds.  Abdominal:     General: There is no distension.     Palpations: Abdomen is soft.     Tenderness: There is no abdominal tenderness.  Genitourinary:    Comments: No cva tenderness. Musculoskeletal:        General: No swelling or tenderness.     Cervical back: Normal range of motion and neck supple. No rigidity or tenderness.     Comments: C spine non tender, normal rom. Good passive rom left shoulder and elbow without pain. LUE is of normal color and warmth. No LUE swelling. Radial pulse 2+.   Skin:    General: Skin is warm and dry.     Findings: No rash.  Neurological:     Mental Status: He is alert.     Comments: Alert, speech clear. LUE nvi with intact motor/sens fxn.   Psychiatric:        Mood and Affect: Mood normal.     ED Results / Procedures / Treatments   Labs (all labs ordered are listed, but only abnormal results are displayed) Results for orders placed or performed during the hospital encounter of 08/22/23  Basic metabolic panel   Collection Time: 08/22/23  6:00 PM  Result Value Ref Range   Sodium 130 (L) 135 - 145 mmol/L   Potassium 4.4 3.5 - 5.1 mmol/L   Chloride 100 98 - 111 mmol/L   CO2 21 (L) 22 - 32 mmol/L   Glucose, Bld 160 (H) 70 - 99 mg/dL   BUN 20 8 - 23 mg/dL   Creatinine, Ser 8.94 0.61 - 1.24 mg/dL   Calcium 9.1 8.9 - 89.6 mg/dL   GFR, Estimated >39 >39 mL/min   Anion gap 9 5 - 15  CBC   Collection Time: 08/22/23  6:00 PM  Result Value Ref Range   WBC 11.1 (H) 4.0 - 10.5 K/uL   RBC 6.05 (H) 4.22 - 5.81 MIL/uL   Hemoglobin 19.2 (H) 13.0 - 17.0 g/dL   HCT 45.3 (H) 60.9 - 47.9 %   MCV 90.2 80.0 - 100.0 fL   MCH 31.7 26.0 - 34.0 pg   MCHC 35.2 30.0 - 36.0 g/dL   RDW 86.8 88.4 - 84.4 %   Platelets 231 150 - 400 K/uL   nRBC 0.0 0.0 - 0.2 %  Troponin I (High Sensitivity)    Collection Time: 08/22/23  6:00 PM  Result Value Ref Range   Troponin I (High Sensitivity) 43 (H) <18 ng/L  Troponin I (High Sensitivity)   Collection Time: 08/22/23  8:12 PM  Result Value  Ref Range   Troponin I (High Sensitivity) 46 (H) <18 ng/L   DG Chest 2 View Result Date: 08/22/2023 CLINICAL DATA:  Left arm pain for 1 year. Worsened over the past 3 days. EXAM: CHEST - 2 VIEW COMPARISON:  Chest radiographs 11/25/2022 FINDINGS: Left chest wall cardiac AICD with leads overlying the right atrium, right ventricle, and coronary sinus. Cardiac silhouette and mediastinal contours are within limits. Moderate calcification within the aortic arch. The lungs are clear. No pleural effusion or pneumothorax. Mild anterior wedging of multiple midthoracic vertebral bodies is unchanged from prior and chronic. Mild multilevel disc space narrowing. IMPRESSION: No active cardiopulmonary disease. Electronically Signed   By: Tanda Lyons M.D.   On: 08/22/2023 19:33   US  LIMITED JOINT SPACE STRUCTURES UP LEFT(NO LINKED CHARGES) Result Date: 08/15/2023 Procedure: Real-time Ultrasound Guided Injection of right glenohumeral joint Device: GE Logiq Q7 Ultrasound guided injection is preferred based studies that show increased duration, increased effect, greater accuracy, decreased procedural pain, increased response rate with ultrasound guided versus blind injection. Verbal informed consent obtained. Time-out conducted. Noted no overlying erythema, induration, or other signs of local infection. Skin prepped in a sterile fashion. Local anesthesia: Topical Ethyl chloride. With sterile technique and under real time ultrasound guidance:  Joint visualized.  23g 1  inch needle inserted posterior approach. Pictures taken for needle placement. Patient did have injection of 2 cc of 1% lidocaine , 2 cc of 0.5% Marcaine , and 1.0 cc of Kenalog  40 mg/dL. Completed without difficulty Pain immediately resolved suggesting accurate placement of  the medication. Advised to call if fevers/chills, erythema, induration, drainage, or persistent bleeding. Impression: Technically successful ultrasound guided injection.  Procedure: Real-time Ultrasound Guided Injection of right acromioclavicular joint Device: GE Logiq Q7 Ultrasound guided injection is preferred based studies that show increased duration, increased effect, greater accuracy, decreased procedural pain, increased response rate, and decreased cost with ultrasound guided versus blind injection. Verbal informed consent obtained. Time-out conducted. Noted no overlying erythema, induration, or other signs of local infection. Skin prepped in a sterile fashion. Local anesthesia: Topical Ethyl chloride. With sterile technique and under real time ultrasound guidance: With a 25-gauge half inch needle injected with 0.5 cc of 0.5% Marcaine  and 0.5 cc of Kenalog  40 mg/mL Completed without difficulty Pain immediately resolved suggesting accurate placement of the medication. Advised to call if fevers/chills, erythema, induration, drainage, or persistent bleeding. Impression: Technically successful ultrasound guided injection.  Procedure: Real-time Ultrasound Guided Injection of left glenohumeral joint Device: GE Logiq E Ultrasound guided injection is preferred based studies that show increased duration, increased effect, greater accuracy, decreased procedural pain, increased response rate with ultrasound guided versus blind injection. Verbal informed consent obtained. Time-out conducted. Noted no overlying erythema, induration, or other signs of local infection. Skin prepped in a sterile fashion. Local anesthesia: Topical Ethyl chloride. With sterile technique and under real time ultrasound guidance:  Joint visualized.  21g 2 inch needle inserted posterior approach. Pictures taken for needle placement. Patient did have injection of 2 cc of 0.5% Marcaine , and 1cc of Kenalog  40 mg/dL. Completed without difficulty Pain  immediately resolved suggesting accurate placement of the medication. Advised to call if fevers/chills, erythema, induration, drainage, or persistent bleeding. Images permanently stored and available for review in the ultrasound unit. Impression: Technically successful ultrasound guided injection.  Procedure: Real-time Ultrasound Guided Injection of left acromioclavicular joint Device: GE Logiq Q7 Ultrasound guided injection is preferred based studies that show increased duration, increased effect, greater accuracy, decreased procedural pain, increased response rate,  and decreased cost with ultrasound guided versus blind injection. Verbal informed consent obtained. Time-out conducted. Noted no overlying erythema, induration, or other signs of local infection. Skin prepped in a sterile fashion. Local anesthesia: Topical Ethyl chloride. With sterile technique and under real time ultrasound guidance: With a 25-gauge half inch needle injected with 0.5 cc of 0.5% Marcaine  and 0.5 cc of Kenalog  40 mg/mL Completed without difficulty Pain immediately resolved suggesting accurate placement of the medication. Advised to call if fevers/chills, erythema, induration, drainage, or persistent bleeding. Impression: Technically successful ultrasound guided injection.     EKG EKG Interpretation Date/Time:  Monday August 22 2023 18:02:04 EST Ventricular Rate:  68 PR Interval:  159 QRS Duration:  202 QT Interval:  492 QTC Calculation: 524 R Axis:   242  Text Interpretation: Electronic ventricular pacemaker Confirmed by Bernard Drivers ([id number]) on 08/22/2023 6:08:42 PM  Radiology DG Chest 2 View Result Date: 08/22/2023 CLINICAL DATA:  Left arm pain for 1 year. Worsened over the past 3 days. EXAM: CHEST - 2 VIEW COMPARISON:  Chest radiographs 11/25/2022 FINDINGS: Left chest wall cardiac AICD with leads overlying the right atrium, right ventricle, and coronary sinus. Cardiac silhouette and mediastinal contours are within  limits. Moderate calcification within the aortic arch. The lungs are clear. No pleural effusion or pneumothorax. Mild anterior wedging of multiple midthoracic vertebral bodies is unchanged from prior and chronic. Mild multilevel disc space narrowing. IMPRESSION: No active cardiopulmonary disease. Electronically Signed   By: Tanda Lyons M.D.   On: 08/22/2023 19:33    Procedures Procedures    Medications Ordered in ED Medications - No data to display  ED Course/ Medical Decision Making/ A&P                                 Medical Decision Making Problems Addressed: Erythrocytosis:    Details: Acute/chronic Essential hypertension: chronic illness or injury History of CAD (coronary artery disease): chronic illness or injury that poses a threat to life or bodily functions History of ischemic cardiomyopathy: chronic illness or injury that poses a threat to life or bodily functions Hyponatremia: acute illness or injury Left arm pain: acute illness or injury  Amount and/or Complexity of Data Reviewed Independent Historian: spouse    Details: hx External Data Reviewed: notes. Labs: ordered. Decision-making details documented in ED Course. Radiology: ordered and independent interpretation performed. Decision-making details documented in ED Course. ECG/medicine tests: ordered and independent interpretation performed. Decision-making details documented in ED Course. Discussion of management or test interpretation with external provider(s): cardiology  Risk Decision regarding hospitalization.   Iv ns. Continuous pulse ox and cardiac monitoring. Labs ordered/sent. Imaging ordered.   Differential diagnosis includes atypical acs, msk pain, etc. Dispo decision including potential need for admission considered - will get labs and imaging and reassess.   Reviewed nursing notes and prior charts for additional history. External reports reviewed. Additional history from: spouse.   Cardiac  monitor: sinus rhythm, rate 70.  Labs reviewed/interpreted by me - wbc 11, k normal. Initial trop 43, mildly elevated, but lower than two prior results.  Pt denies any current or recentchest pain or discomfort, no current arm pain. Delta trop similar to initial, not significantly increasing, and also lower than two priors. Na mildly low, hgb elev. Ns bolus.   Will discuss/consult with cardiology.    Xrays reviewed/interpreted by me - no pna.   Recheck pt, no chest discomfort. No arm pain.  No sob.   2200 cardiology call back pending.  Cardiology returned call, discussed pt, reviewed prior cath, current labs, recent symptoms - on call cards (Dr Celine) rec d/c with outpatient cardiology f/u.   Pt remains symptom free and appears stable for d/c.   Return precautions provided.          Final Clinical Impression(s) / ED Diagnoses Final diagnoses:  Left arm pain    Rx / DC Orders ED Discharge Orders     None        Bernard Drivers, MD 08/22/23 2208

## 2023-08-22 NOTE — Discharge Instructions (Addendum)
 It was our pleasure to provide your ER care today - we hope that you feel better.  Take an enteric coated aspirin  a day. Avoid any smoking.  Drink plenty of fluids/stay well hydrated.   Follow up closely with your cardiologist in the coming week - we made referral and they should be contacting you with an appointment in the next 1-2 days (if you have not heard from them in the next day, call office to arrange appointment time for this week).   From today's labs, your blood count is high (hemoglobin 19) and sodium level mildly low (130) - stay well hydrated and follow up with your doctor.    Return to ER right away if worse, new symptoms, chest pain, increased trouble breathing, or other concern.

## 2023-08-22 NOTE — Telephone Encounter (Signed)
   Pt c/o of Chest Pain: STAT if active CP, including tightness, pressure, jaw pain, radiating pain to shoulder/upper arm/back, CP unrelieved by Nitro. Symptoms reported of SOB, nausea, vomiting, sweating.  1. Are you having CP right now? no    2. Are you experiencing any other symptoms (ex. SOB, nausea, vomiting, sweating)? Sweating, left arm pain   3. Is your CP continuous or coming and going? Coming and going   4. Have you taken Nitroglycerin ? No does not have   5. How long have you been experiencing CP? 3 days    6. If NO CP at time of call then end call with telling Pt to call back or call 911 if Chest pain returns prior to return call from triage team. pain

## 2023-08-22 NOTE — Telephone Encounter (Signed)
 Spoke with pt's wife who had called and stated that the pt has been having CP that has for the most part been consistent for the past 3 days. Pt has also been having left arm pain and will get sweaty. Pt's wife states that the pt's HR will spike over small activity which is very unusual for him and states that the pt went to work today and has felt bad all day. Pt's wife states pt keeps blowing off these symptoms and wanted to call us  to see what to do. Pt's wife advised that with pt's symptoms, it would be best to have him evaluated at the ED. Pt's wife stated she is concerned she won't be able to get him to agree to go. Advised pt's wife that if that is the case, she can call 911 and have an EMS unit come to the house to evaluate him. Pt's wife verbalized understanding and had no further questions at this time.

## 2023-08-22 NOTE — ED Triage Notes (Signed)
 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 Concerned for cardiac issues , denies chest pain or shortness of breath , Reports nausea .

## 2023-08-22 NOTE — ED Notes (Signed)
 Pt denies CP, L arm pain, ShOB at this time. Cardiac monitoring on, call bell within reach, family at bedside.

## 2023-08-23 ENCOUNTER — Telehealth: Payer: Self-pay

## 2023-08-23 NOTE — Telephone Encounter (Signed)
-----   Message from Charlton Haws sent at 08/23/2023 11:31 AM EST ----- Was in ER with chest pain last night spoke with wife see if he wants to be added on between 9-11 or 2-4 tomorrow

## 2023-08-23 NOTE — Telephone Encounter (Signed)
 Called patient. Scheduled patient for office visit tomorrow with Dr. Eden Emms.

## 2023-08-23 NOTE — Progress Notes (Signed)
 CARDIOLOGY CONSULT NOTE       Patient ID: Mady Schlichter. MRN: [id number] DOB/AGE: Jun 03, 1956 68 y.o.  Admit date: (Not on file) Referring Physician: Cresenciano Doing Primary Physician: Patient, No Pcp Per Primary Cardiologist: Stann Earnest    HPI:  68 y.o. 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 DAT scan negative   PMH indicates history of CAD with stent in 2010 Seen by Dr Ransom Byers cardiology [facility] in 2021 His note indicated NSTEMI 2010 with stenting of RCA   He is a smoker with over 69 packyear history Lung cancer CT negative 08/04/22.    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   Cath 08/11/22 showed CTO of RCA proximal to prior stent with weak left to right collaterals CTO mid LCX after OM 1 and diffuse 40-50% LAD Medical Rx thought best  Myovue done 07/09/22 showed old IMI estimated EF 29% Carotid with bilateral 40-59% ICA stenosis 04/28/23  TTE 04/28/23 EF 40-45% improved   Admitted to hospital with pre syncope and complete heart block that did not go away off beta blocker Abbott CRT-D placed by Dr Arlester Ladd on 11/23/22   TTE 04/28/23 40-45%   Started on Zetia  to get LDL to target. Smoking down to < 1 ppd. Has a new CVO [place]   Seen in ED 08/22/23 Atypical left shoulder/arm pain.  Has bad shoulders and sees Zach Smith has had both Injected. Some diaphoresis and fatigue Pain radiates from shoulder down inside left arm Activity and being out in cold make it worse. Also having dyspnea. CXR NAD troponin negative x 2 ECG with V pacing non diagnostic   His pain seems muscular It is partially reproducible with shoulder maneuvers and is similar to pain he had before his first injection 2 years ago. Discussed trying SL nitroglycerin  for the pain to see if it helps New script called in    ROS All other systems reviewed and negative except as noted above  Past Medical History:  Diagnosis Date   Arthritis    BPH (benign prostatic hyperplasia)    CAD (coronary artery disease)    Coronary artery disease    GAD (generalized anxiety disorder)    Hyperglycemia    Hypertension    Lightheadedness    Myocardial infarction [facility]) 2010   stent   Syncope    Tremor     Family History  Problem Relation Age of Onset   Hypertension Mother    Hypertension Father    Aortic aneurysm Father     Social History   Socioeconomic History   Marital status: Married    Spouse name: Not on file   Number of children: Not on file   Years of education: Not  on file   Highest education level: Not on file  Occupational History   Not on file  Tobacco Use   Smoking status: Every Day    Current packs/day: 1.00    Types: Cigarettes   Smokeless tobacco: Never  Vaping Use   Vaping status: Some Days  Substance and Sexual Activity   Alcohol use: Not Currently   Drug use: Never   Sexual activity: Not on file  Other Topics Concern   Not on file  Social History Narrative   Not on file   Social Drivers of Health   Financial Resource Strain: Not on file  Food Insecurity: No Food Insecurity (11/24/2022)   Hunger Vital Sign    Worried About Running Out of Food in the Last Year: Never true    Ran Out of Food in the Last Year: Never true  Transportation Needs: No Transportation Needs  (11/24/2022)   PRAPARE - Administrator, Civil Service (Medical): No    Lack of Transportation (Non-Medical): No  Physical Activity: Not on file  Stress: Not on file  Social Connections: Unknown (12/20/2021)   [facility] from [facility]   Social Network    Social Network: Not on file  Intimate Partner Violence: Not At Risk (11/24/2022)   Humiliation, Afraid, Rape, and Kick questionnaire    Fear of Current or Ex-Partner: No    Emotionally Abused: No    Physically Abused: No    Sexually Abused: No    Past Surgical History:  Procedure Laterality Date   ARTERY AND TENDON REPAIR Left 07/15/2020   Procedure: LEFT INDEX , LONG,AND RING FINGER EXPLORATION ,REPAIR TENDON,ARTERY AND NERVE;  Surgeon: Brunilda Capra, MD;  Location: [facility] OR;  Service: Orthopedics;  Laterality: Left;   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   Procedure: BIV ICD INSERTION CRT-D;  Surgeon: Mealor, Donnamae Gaba, MD;  Location: [facility] INVASIVE CV LAB;  Service: Cardiovascular;  Laterality: N/A;   CORONARY ANGIOPLASTY  2010   LEFT HEART CATH AND CORONARY ANGIOGRAPHY N/A 08/11/2022   Procedure: LEFT HEART CATH AND CORONARY ANGIOGRAPHY;  Surgeon: Arty Binning, MD;  Location: [facility] INVASIVE CV LAB;  Service: Cardiovascular;  Laterality: N/A;   LIPOMA EXCISION  2012   forehead   TEMPORARY PACEMAKER N/A 11/23/2022   Procedure: TEMPORARY PACEMAKER;  Surgeon: Arnoldo Lapping, MD;  Location: [facility] INVASIVE CV LAB;  Service: Cardiovascular;  Laterality: N/A;      Current Outpatient Medications:    aspirin  EC 81 MG tablet, Take 81 mg by mouth every evening. Swallow whole., Disp: , Rfl:    carvedilol  (COREG ) 12.5 MG tablet, Take 1 tablet (12.5 mg total) by mouth 2 (two) times daily with a meal., Disp: 180 tablet, Rfl: 3   Evolocumab  (REPATHA  SURECLICK) 140 MG/ML SOAJ, Inject 140 mg into the skin every 14 (fourteen) days., Disp: 2 mL, Rfl: 11   ezetimibe  (ZETIA ) 10 MG tablet, Take 1 tablet (10 mg total) by mouth daily.,  Disp: 90 tablet, Rfl: 3   FARXIGA  10 MG TABS tablet, Take 1 tablet (10 mg total) by mouth daily., Disp: 90 tablet, Rfl: 3   FLUoxetine  (PROZAC ) 20 MG capsule, Take 1 capsule (20 mg total) by mouth daily., Disp: 90 capsule, Rfl: 3   loratadine-pseudoephedrine (CLARITIN-D 12-HOUR) 5-120 MG tablet, Take 1 tablet by mouth daily as needed for allergies., Disp: , Rfl:    naproxen sodium (ALEVE) 220 MG tablet, Take 440 mg by mouth daily as needed (pain)., Disp: , Rfl:  sacubitril -valsartan  (ENTRESTO ) 24-26 MG, Take 1 tablet by mouth 2 (two) times daily., Disp: 180 tablet, Rfl: 3   spironolactone  (ALDACTONE ) 25 MG tablet, Take 0.5 tablets (12.5 mg total) by mouth daily., Disp: 90 tablet, Rfl: 1   tadalafil (CIALIS) 5 MG tablet, Take 5 mg by mouth every evening., Disp: , Rfl:    carvedilol  (COREG ) 12.5 MG tablet, TAKE 1 TABLET BY MOUTH TWICE DAILY WITH A MEAL (Patient not taking: Reported on 08/24/2023), Disp: 180 tablet, Rfl: 0  Current Facility-Administered Medications:    sodium chloride  flush (NS) 0.9 % injection 3 mL, 3 mL, Intravenous, Q12H, Pranathi Winfree C, MD    Physical Exam: Blood pressure 120/80, pulse 65, height 5\' 10"  (1.778 m), weight 210 lb (95.3 kg), SpO2 96%.    Affect appropriate Chronically ill male HEENT: normal Neck supple with no adenopathy JVP normal bilateral bruits no thyromegaly Lungs clear with no wheezing and good diaphragmatic motion Heart:  S1/S2 SEM murmur, no rub, gallop or click PMI enlarged AICD under left clavicle  Abdomen: benighn, BS positve, no tenderness, no AAA no bruit.  No HSM or HJR Distal pulses palpable  No edema Neuro non-focal Skin warm and dry No muscular weakness  Labs:   Lab Results  Component Value Date   WBC 11.1 (H) 08/22/2023   HGB 19.2 (H) 08/22/2023   HCT 54.6 (H) 08/22/2023   MCV 90.2 08/22/2023   PLT 231 08/22/2023    Recent Labs  Lab 08/22/23 1800  NA 130*  K 4.4  CL 100  CO2 21*  BUN 20  CREATININE 1.05  CALCIUM  9.1  GLUCOSE 160*   No results found for: "CKTOTAL", "CKMB", "CKMBINDEX", "TROPONINI"  Lab Results  Component Value Date   CHOL 162 05/25/2023   CHOL 184 02/18/2023   Lab Results  Component Value Date   HDL 42 05/25/2023   HDL 40 02/18/2023   Lab Results  Component Value Date   LDLCALC 92 05/25/2023   LDLCALC 107 (H) 02/18/2023   Lab Results  Component Value Date   TRIG 161 (H) 05/25/2023   TRIG 215 (H) 02/18/2023   Lab Results  Component Value Date   CHOLHDL 3.9 05/25/2023   CHOLHDL 4.6 02/18/2023   No results found for: "LDLDIRECT"    Radiology: DG Chest 2 View Result Date: 08/22/2023 CLINICAL DATA:  Left arm pain for 1 year. Worsened over the past 3 days. EXAM: CHEST - 2 VIEW COMPARISON:  Chest radiographs 11/25/2022 FINDINGS: Left chest wall cardiac AICD with leads overlying the right atrium, right ventricle, and coronary sinus. Cardiac silhouette and mediastinal contours are within limits. Moderate calcification within the aortic arch. The lungs are clear. No pleural effusion or pneumothorax. Mild anterior wedging of multiple midthoracic vertebral bodies is unchanged from prior and chronic. Mild multilevel disc space narrowing. IMPRESSION: No active cardiopulmonary disease. Electronically Signed   By: Bertina Broccoli M.D.   On: 08/22/2023 19:33   US  LIMITED JOINT SPACE STRUCTURES UP LEFT(NO LINKED CHARGES) Result Date: 08/15/2023 Procedure: Real-time Ultrasound Guided Injection of right glenohumeral joint Device: GE Logiq Q7 Ultrasound guided injection is preferred based studies that show increased duration, increased effect, greater accuracy, decreased procedural pain, increased response rate with ultrasound guided versus blind injection. Verbal informed consent obtained. Time-out conducted. Noted no overlying erythema, induration, or other signs of local infection. Skin prepped in a sterile fashion. Local anesthesia: Topical Ethyl chloride. With sterile technique and under  real time ultrasound guidance:  Joint visualized.  23g  1  inch needle inserted posterior approach. Pictures taken for needle placement. Patient did have injection of 2 cc of 1% lidocaine , 2 cc of 0.5% Marcaine , and 1.0 cc of Kenalog  40 mg/dL. Completed without difficulty Pain immediately resolved suggesting accurate placement of the medication. Advised to call if fevers/chills, erythema, induration, drainage, or persistent bleeding. Impression: Technically successful ultrasound guided injection.  Procedure: Real-time Ultrasound Guided Injection of right acromioclavicular joint Device: GE Logiq Q7 Ultrasound guided injection is preferred based studies that show increased duration, increased effect, greater accuracy, decreased procedural pain, increased response rate, and decreased cost with ultrasound guided versus blind injection. Verbal informed consent obtained. Time-out conducted. Noted no overlying erythema, induration, or other signs of local infection. Skin prepped in a sterile fashion. Local anesthesia: Topical Ethyl chloride. With sterile technique and under real time ultrasound guidance: With a 25-gauge half inch needle injected with 0.5 cc of 0.5% Marcaine  and 0.5 cc of Kenalog  40 mg/mL Completed without difficulty Pain immediately resolved suggesting accurate placement of the medication. Advised to call if fevers/chills, erythema, induration, drainage, or persistent bleeding. Impression: Technically successful ultrasound guided injection.  Procedure: Real-time Ultrasound Guided Injection of left glenohumeral joint Device: GE Logiq E Ultrasound guided injection is preferred based studies that show increased duration, increased effect, greater accuracy, decreased procedural pain, increased response rate with ultrasound guided versus blind injection. Verbal informed consent obtained. Time-out conducted. Noted no overlying erythema, induration, or other signs of local infection. Skin prepped in a sterile  fashion. Local anesthesia: Topical Ethyl chloride. With sterile technique and under real time ultrasound guidance:  Joint visualized.  21g 2 inch needle inserted posterior approach. Pictures taken for needle placement. Patient did have injection of 2 cc of 0.5% Marcaine , and 1cc of Kenalog  40 mg/dL. Completed without difficulty Pain immediately resolved suggesting accurate placement of the medication. Advised to call if fevers/chills, erythema, induration, drainage, or persistent bleeding. Images permanently stored and available for review in the ultrasound unit. Impression: Technically successful ultrasound guided injection.  Procedure: Real-time Ultrasound Guided Injection of left acromioclavicular joint Device: GE Logiq Q7 Ultrasound guided injection is preferred based studies that show increased duration, increased effect, greater accuracy, decreased procedural pain, increased response rate, and decreased cost with ultrasound guided versus blind injection. Verbal informed consent obtained. Time-out conducted. Noted no overlying erythema, induration, or other signs of local infection. Skin prepped in a sterile fashion. Local anesthesia: Topical Ethyl chloride. With sterile technique and under real time ultrasound guidance: With a 25-gauge half inch needle injected with 0.5 cc of 0.5% Marcaine  and 0.5 cc of Kenalog  40 mg/mL Completed without difficulty Pain immediately resolved suggesting accurate placement of the medication. Advised to call if fevers/chills, erythema, induration, drainage, or persistent bleeding. Impression: Technically successful ultrasound guided injection.    EKG: SR rate 72 LAD RBBB PR 212 msec ? Old IMI    ASSESSMENT AND PLAN:   CAD:  distant stent to RCA in 2010 occluded RCA/LCX with moderate LAD dx by cath 08/11/22 Med Rx Recent ER visit for mostly left arm pain No role for non invasive testing given CTO RCA and LCX with collaterals  F/U with [facility] Consider more imaging of C  spine Do not think cath indicated at this time SL nitroglycerin  called in D/C Cialis  HLD:  now on Repatha  lipitor caused nausea and GI upset   LDL 92 also started on Zetia  f/u labs  Smoking: counseled on cessation < 10 minutes Lung cancer CT negative 07/15/22  CRT-D  with CHB and PVC burden Abbott CRT-D placed 11/24/22 F/U with Dr Arlester Ladd Normal function  HTN:  continue current meds Bruit:  bilateral 40-59% ICA stenosis f/u duplex, repeat September 2025  Ischemic DCM:  EF  improved 40-45% by TTE 04/28/23  GDMT with coreg , farxiga , entresto  and aldactone     Lipid/Liver now on zetia  with Repatha  Lung cancer CT SL nitroglycerin   D/C Cialis  F/U in 3 months   Signed: Janelle Mediate 08/24/2023, 3:15 PM

## 2023-08-24 ENCOUNTER — Ambulatory Visit: Payer: BC Managed Care – PPO | Attending: Cardiovascular Disease | Admitting: Cardiovascular Disease

## 2023-08-24 VITALS — BP 120/80 | HR 65 | Ht 70.0 in | Wt 210.0 lb

## 2023-08-24 DIAGNOSIS — E782 Mixed hyperlipidemia: Secondary | ICD-10-CM

## 2023-08-24 DIAGNOSIS — I420 Dilated cardiomyopathy: Secondary | ICD-10-CM | POA: Diagnosis not present

## 2023-08-24 DIAGNOSIS — Z87891 Personal history of nicotine dependence: Secondary | ICD-10-CM | POA: Diagnosis not present

## 2023-08-24 DIAGNOSIS — I442 Atrioventricular block, complete: Secondary | ICD-10-CM

## 2023-08-24 DIAGNOSIS — I10 Essential (primary) hypertension: Secondary | ICD-10-CM | POA: Diagnosis not present

## 2023-08-24 DIAGNOSIS — R079 Chest pain, unspecified: Secondary | ICD-10-CM

## 2023-08-24 MED ORDER — NITROGLYCERIN 0.4 MG SL SUBL: SUBLINGUAL_TABLET | SUBLINGUAL | 3 refills | Status: DC

## 2023-08-24 NOTE — Patient Instructions (Signed)
 Medication Instructions:  Your physician has recommended you make the following change in your medication:  -Take 1 Nitroglycerin  tablet under your tongue, while sitting.  If no relief of pain may repeat NTG, one tab every 5 minutes up to 3 tablets total over 15 minutes.  If no relief CALL 911.  If you have dizziness/lightheadness  while taking NTG, stop taking and call 911.   -You can not take nitroglycerin  while on Cialis, please talk to your prescribing doctor about other alternatives.   *If you need a refill on your cardiac medications before your next appointment, please call your pharmacy*  Lab Work: Your physician recommends that you have lab work at [facility]- fasting lipid and liver panel in one month. If you have labs (blood work) drawn today and your tests are completely normal, you will receive your results only by: MyChart Message (if you have MyChart) OR A paper copy in the mail If you have any lab test that is abnormal or we need to change your treatment, we will call you to review the results.  Testing/Procedures: Non-Cardiac CT scanning for lung cancer screening, (CAT scanning), is a noninvasive, special x-[place] that produces cross-sectional images of the body using x-rays and a computer. CT scans help physicians diagnose and treat medical conditions. For some CT exams, a contrast material is used to enhance visibility in the area of the body being studied. CT scans provide greater clarity and reveal more details than regular x-[place] exams.  Follow-Up: At [facility], you and your health needs are our priority.  As part of our continuing mission to provide you with exceptional heart care, we have created designated Provider Care Teams.  These Care Teams include your primary Cardiologist (physician) and Advanced Practice Providers (APPs -  Physician Assistants and Nurse Practitioners) who all work together to provide you with the care you need, when you need it.  We  recommend signing up for the patient portal called "MyChart".  Sign up information is provided on this After Visit Summary.  MyChart is used to connect with patients for Virtual Visits (Telemedicine).  Patients are able to view lab/test results, encounter notes, upcoming appointments, etc.  Non-urgent messages can be sent to your provider as well.   To learn more about what you can do with MyChart, go to [email or web address].    Your next appointment:   3 month(s)  Provider:   Janelle Mediate, MD

## 2023-08-25 ENCOUNTER — Ambulatory Visit (INDEPENDENT_AMBULATORY_CARE_PROVIDER_SITE_OTHER): Payer: BC Managed Care – PPO

## 2023-08-25 DIAGNOSIS — I420 Dilated cardiomyopathy: Secondary | ICD-10-CM

## 2023-08-25 LAB — CUP PACEART REMOTE DEVICE CHECK
Battery Remaining Longevity: 89 mo
Battery Remaining Percentage: 86 %
Battery Voltage: 2.99 V
Brady Statistic AP VP Percent: 17 %
Brady Statistic AP VS Percent: 1 %
Brady Statistic AS VP Percent: 75 %
Brady Statistic AS VS Percent: 3.2 %
Brady Statistic RA Percent Paced: 13 %
Date Time Interrogation Session: 20250116010333
HighPow Impedance: 84 Ohm
Implantable Lead Connection Status: 753985
Implantable Lead Connection Status: 753985
Implantable Lead Connection Status: 753985
Implantable Lead Implant Date: 20240417
Implantable Lead Implant Date: 20240417
Implantable Lead Implant Date: 20240417
Implantable Lead Location: 753858
Implantable Lead Location: 753859
Implantable Lead Location: 753860
Implantable Pulse Generator Implant Date: 20240417
Lead Channel Impedance Value: 390 Ohm
Lead Channel Impedance Value: 580 Ohm
Lead Channel Impedance Value: 860 Ohm
Lead Channel Pacing Threshold Amplitude: 0.5 V
Lead Channel Pacing Threshold Amplitude: 0.5 V
Lead Channel Pacing Threshold Amplitude: 1 V
Lead Channel Pacing Threshold Pulse Width: 0.5 ms
Lead Channel Pacing Threshold Pulse Width: 0.5 ms
Lead Channel Pacing Threshold Pulse Width: 0.5 ms
Lead Channel Sensing Intrinsic Amplitude: 12 mV
Lead Channel Sensing Intrinsic Amplitude: 5 mV
Lead Channel Setting Pacing Amplitude: 1.5 V
Lead Channel Setting Pacing Amplitude: 1.5 V
Lead Channel Setting Pacing Amplitude: 1.5 V
Lead Channel Setting Pacing Pulse Width: 0.5 ms
Lead Channel Setting Pacing Pulse Width: 0.5 ms
Lead Channel Setting Sensing Sensitivity: 0.5 mV
Pulse Gen Serial Number: 111060096

## 2023-08-29 ENCOUNTER — Ambulatory Visit: Payer: BC Managed Care – PPO | Admitting: Family Medicine

## 2023-08-29 ENCOUNTER — Ambulatory Visit (INDEPENDENT_AMBULATORY_CARE_PROVIDER_SITE_OTHER): Payer: BC Managed Care – PPO

## 2023-08-29 VITALS — BP 144/82 | HR 66 | Ht 70.0 in | Wt 211.0 lb

## 2023-08-29 DIAGNOSIS — G8929 Other chronic pain: Secondary | ICD-10-CM | POA: Diagnosis not present

## 2023-08-29 DIAGNOSIS — M5412 Radiculopathy, cervical region: Secondary | ICD-10-CM

## 2023-08-29 DIAGNOSIS — M47812 Spondylosis without myelopathy or radiculopathy, cervical region: Secondary | ICD-10-CM | POA: Diagnosis not present

## 2023-08-29 DIAGNOSIS — I6523 Occlusion and stenosis of bilateral carotid arteries: Secondary | ICD-10-CM | POA: Diagnosis not present

## 2023-08-29 DIAGNOSIS — M542 Cervicalgia: Secondary | ICD-10-CM | POA: Diagnosis not present

## 2023-08-29 MED ORDER — PREDNISONE 50 MG PO TABS
ORAL_TABLET | ORAL | 0 refills | Status: DC
Start: 2023-08-29 — End: 2023-09-02

## 2023-08-29 MED ORDER — GABAPENTIN 100 MG PO CAPS
100.0000 mg | ORAL_CAPSULE | Freq: Every day | ORAL | 3 refills | Status: DC
Start: 2023-08-29 — End: 2023-09-02

## 2023-08-29 NOTE — Patient Instructions (Addendum)
Thank you for coming in today.   Please get an Xray today before you leave   I've sent a prescription for Gabapentin & Prednisone to your pharmacy.   Check back as needed

## 2023-08-29 NOTE — Progress Notes (Unsigned)
   Rubin Payor, PhD, LAT, ATC acting as a scribe for Clementeen Graham, MD.  Grant Ochoa. is a 68 y.o. male who presents to [facility] today for worsening L shoulder pain. Pt was last seen by Dr. Katrinka Blazing on 08/08/23 and was given a L GH and AC joint steroid injections. Pt was later seen at the [facility] ED on 1/13.  Today, pt reports he has been treated previously for a torn levator scapular and that pain has returned and worsened around the 13th. He locates pain to the midline of his thoracic spine, w/ radiating pain along medial-posterior upper arm, elbow, and anterior forearm to palm. No numbness/tingling  Dx imaging: 03/31/20 L shoulder XR  Pertinent review of systems: ***  Relevant historical information: ***   Exam:  There were no vitals taken for this visit. General: Well Developed, well nourished, and in no acute distress.   MSK: ***    Lab and Radiology Results No results found for this or any previous visit (from the past 72 hours). No results found.     Assessment and Plan: 68 y.o. male with ***   PDMP not reviewed this encounter. No orders of the defined types were placed in this encounter.  No orders of the defined types were placed in this encounter.    Discussed warning signs or symptoms. Please see discharge instructions. Patient expresses understanding.   ***

## 2023-08-30 ENCOUNTER — Encounter: Payer: Self-pay | Admitting: Family Medicine

## 2023-09-02 ENCOUNTER — Encounter: Payer: Self-pay | Admitting: Family Medicine

## 2023-09-02 NOTE — Progress Notes (Signed)
X-Breckyn cervical spine shows some arthritis but otherwise looks okay.

## 2023-09-07 ENCOUNTER — Ambulatory Visit: Payer: BC Managed Care – PPO | Admitting: Family Medicine

## 2023-09-08 ENCOUNTER — Other Ambulatory Visit: Payer: Self-pay | Admitting: Cardiovascular Disease

## 2023-09-10 DEATH — deceased

## 2023-10-03 NOTE — Progress Notes (Signed)
 Remote ICD transmission.

## 2023-10-11 ENCOUNTER — Ambulatory Visit: Payer: BC Managed Care – PPO | Admitting: Family Medicine

## 2023-12-14 ENCOUNTER — Ambulatory Visit: Payer: BC Managed Care – PPO | Admitting: Cardiovascular Disease

## 2024-04-16 ENCOUNTER — Encounter (HOSPITAL_COMMUNITY): Payer: BC Managed Care – PPO
